# Patient Record
Sex: Female | Born: 1960 | Race: White | Hispanic: No | Marital: Married | Smoking: Former smoker
Health system: Southern US, Community
[De-identification: ages and names within clinical notes are randomized; demographics above are authoritative.]

## PROBLEM LIST (undated history)

## (undated) DIAGNOSIS — G43909 Migraine, unspecified, not intractable, without status migrainosus: Secondary | ICD-10-CM

## (undated) DIAGNOSIS — N3281 Overactive bladder: Secondary | ICD-10-CM

## (undated) DIAGNOSIS — K219 Gastro-esophageal reflux disease without esophagitis: Secondary | ICD-10-CM

## (undated) DIAGNOSIS — M1990 Unspecified osteoarthritis, unspecified site: Secondary | ICD-10-CM

## (undated) DIAGNOSIS — J309 Allergic rhinitis, unspecified: Secondary | ICD-10-CM

## (undated) DIAGNOSIS — G894 Chronic pain syndrome: Secondary | ICD-10-CM

## (undated) DIAGNOSIS — E785 Hyperlipidemia, unspecified: Secondary | ICD-10-CM

## (undated) HISTORY — PX: OTHER SURGICAL HISTORY: SHX169

## (undated) HISTORY — PX: SPINAL CORD STIMULATOR IMPLANT: SHX2422

## (undated) HISTORY — DX: Allergic rhinitis, unspecified: J30.9

## (undated) HISTORY — DX: Migraine, unspecified, not intractable, without status migrainosus: G43.909

## (undated) HISTORY — PX: SHOULDER SURGERY: SHX246

## (undated) HISTORY — DX: Overactive bladder: N32.81

## (undated) HISTORY — DX: Unspecified osteoarthritis, unspecified site: M19.90

## (undated) HISTORY — DX: Chronic pain syndrome: G89.4

## (undated) HISTORY — DX: Gastro-esophageal reflux disease without esophagitis: K21.9

## (undated) HISTORY — PX: NASAL SEPTUM SURGERY: SHX37

## (undated) HISTORY — DX: Hyperlipidemia, unspecified: E78.5

## (undated) HISTORY — PX: BREAST REDUCTION SURGERY: SHX8

---

## 1999-04-25 ENCOUNTER — Other Ambulatory Visit: Admission: RE | Admit: 1999-04-25 | Discharge: 1999-04-25 | Payer: Self-pay | Admitting: Obstetrics and Gynecology

## 1999-05-12 ENCOUNTER — Ambulatory Visit (HOSPITAL_COMMUNITY): Admission: RE | Admit: 1999-05-12 | Discharge: 1999-05-13 | Payer: Self-pay | Admitting: Neurosurgery

## 1999-05-12 ENCOUNTER — Encounter: Payer: Self-pay | Admitting: Neurosurgery

## 1999-07-30 ENCOUNTER — Ambulatory Visit (HOSPITAL_BASED_OUTPATIENT_CLINIC_OR_DEPARTMENT_OTHER): Admission: RE | Admit: 1999-07-30 | Discharge: 1999-07-30 | Payer: Self-pay | Admitting: *Deleted

## 2000-03-24 ENCOUNTER — Ambulatory Visit (HOSPITAL_COMMUNITY): Admission: RE | Admit: 2000-03-24 | Discharge: 2000-03-25 | Payer: Self-pay | Admitting: Neurosurgery

## 2000-03-24 ENCOUNTER — Encounter: Payer: Self-pay | Admitting: Neurosurgery

## 2000-07-01 ENCOUNTER — Ambulatory Visit (HOSPITAL_COMMUNITY): Admission: RE | Admit: 2000-07-01 | Discharge: 2000-07-01 | Payer: Self-pay | Admitting: Neurosurgery

## 2000-07-01 ENCOUNTER — Encounter: Payer: Self-pay | Admitting: Neurosurgery

## 2000-07-15 ENCOUNTER — Encounter: Payer: Self-pay | Admitting: Neurosurgery

## 2000-07-15 ENCOUNTER — Ambulatory Visit (HOSPITAL_COMMUNITY): Admission: RE | Admit: 2000-07-15 | Discharge: 2000-07-15 | Payer: Self-pay | Admitting: Neurosurgery

## 2000-07-29 ENCOUNTER — Ambulatory Visit (HOSPITAL_COMMUNITY): Admission: RE | Admit: 2000-07-29 | Discharge: 2000-07-29 | Payer: Self-pay | Admitting: Neurosurgery

## 2000-07-29 ENCOUNTER — Encounter: Payer: Self-pay | Admitting: Neurosurgery

## 2000-08-12 ENCOUNTER — Encounter: Payer: Self-pay | Admitting: Neurosurgery

## 2000-08-12 ENCOUNTER — Ambulatory Visit (HOSPITAL_COMMUNITY): Admission: RE | Admit: 2000-08-12 | Discharge: 2000-08-12 | Payer: Self-pay | Admitting: Neurosurgery

## 2000-09-14 ENCOUNTER — Encounter: Payer: Self-pay | Admitting: Neurosurgery

## 2000-09-15 ENCOUNTER — Inpatient Hospital Stay (HOSPITAL_COMMUNITY): Admission: RE | Admit: 2000-09-15 | Discharge: 2000-09-18 | Payer: Self-pay | Admitting: Neurosurgery

## 2000-10-07 ENCOUNTER — Ambulatory Visit (HOSPITAL_COMMUNITY): Admission: RE | Admit: 2000-10-07 | Discharge: 2000-10-07 | Payer: Self-pay | Admitting: Neurosurgery

## 2000-10-07 ENCOUNTER — Encounter: Payer: Self-pay | Admitting: Neurosurgery

## 2000-10-18 ENCOUNTER — Emergency Department (HOSPITAL_COMMUNITY): Admission: EM | Admit: 2000-10-18 | Discharge: 2000-10-18 | Payer: Self-pay | Admitting: Emergency Medicine

## 2000-12-13 ENCOUNTER — Encounter: Payer: Self-pay | Admitting: Neurosurgery

## 2000-12-13 ENCOUNTER — Ambulatory Visit (HOSPITAL_COMMUNITY): Admission: RE | Admit: 2000-12-13 | Discharge: 2000-12-13 | Payer: Self-pay | Admitting: Neurosurgery

## 2001-01-17 ENCOUNTER — Encounter: Payer: Self-pay | Admitting: Neurosurgery

## 2001-01-17 ENCOUNTER — Encounter: Admission: RE | Admit: 2001-01-17 | Discharge: 2001-01-17 | Payer: Self-pay | Admitting: Neurosurgery

## 2001-01-28 ENCOUNTER — Encounter: Payer: Self-pay | Admitting: Neurosurgery

## 2001-01-28 ENCOUNTER — Encounter: Admission: RE | Admit: 2001-01-28 | Discharge: 2001-01-28 | Payer: Self-pay | Admitting: Neurosurgery

## 2001-02-22 ENCOUNTER — Emergency Department (HOSPITAL_COMMUNITY): Admission: EM | Admit: 2001-02-22 | Discharge: 2001-02-22 | Payer: Self-pay | Admitting: Emergency Medicine

## 2001-02-22 ENCOUNTER — Encounter: Admission: RE | Admit: 2001-02-22 | Discharge: 2001-02-22 | Payer: Self-pay | Admitting: Neurosurgery

## 2001-02-22 ENCOUNTER — Encounter: Payer: Self-pay | Admitting: Neurosurgery

## 2001-03-09 ENCOUNTER — Encounter: Payer: Self-pay | Admitting: Neurosurgery

## 2001-03-09 ENCOUNTER — Encounter: Admission: RE | Admit: 2001-03-09 | Discharge: 2001-03-09 | Payer: Self-pay | Admitting: Neurosurgery

## 2007-02-08 ENCOUNTER — Other Ambulatory Visit: Admission: RE | Admit: 2007-02-08 | Discharge: 2007-02-08 | Payer: Self-pay | Admitting: Internal Medicine

## 2007-02-11 ENCOUNTER — Ambulatory Visit (HOSPITAL_COMMUNITY): Admission: RE | Admit: 2007-02-11 | Discharge: 2007-02-11 | Payer: Self-pay | Admitting: Internal Medicine

## 2007-02-15 ENCOUNTER — Encounter: Admission: RE | Admit: 2007-02-15 | Discharge: 2007-02-15 | Payer: Self-pay | Admitting: Internal Medicine

## 2007-05-30 LAB — LIPID PANEL: LDL Cholesterol: 68 mg/dL

## 2007-08-23 LAB — LIPID PANEL: LDL Cholesterol: 70 mg/dL

## 2007-08-23 LAB — HEPATIC FUNCTION PANEL
ALT: 19 U/L (ref 7–35)
AST: 22 U/L (ref 13–35)
Alkaline Phosphatase: 38 U/L (ref 25–125)
Bilirubin, Total: 0.4 mg/dL

## 2007-11-05 ENCOUNTER — Emergency Department (HOSPITAL_COMMUNITY): Admission: EM | Admit: 2007-11-05 | Discharge: 2007-11-05 | Payer: Self-pay | Admitting: Emergency Medicine

## 2007-11-05 ENCOUNTER — Ambulatory Visit: Payer: Self-pay | Admitting: Vascular Surgery

## 2008-03-27 ENCOUNTER — Ambulatory Visit (HOSPITAL_COMMUNITY): Admission: RE | Admit: 2008-03-27 | Discharge: 2008-03-27 | Payer: Self-pay | Admitting: Internal Medicine

## 2008-05-31 ENCOUNTER — Other Ambulatory Visit: Admission: RE | Admit: 2008-05-31 | Discharge: 2008-05-31 | Payer: Self-pay | Admitting: Internal Medicine

## 2009-04-29 LAB — HEMOGLOBIN A1C: Hgb A1c MFr Bld: 5.8 % (ref 4.0–6.0)

## 2009-05-02 ENCOUNTER — Encounter (INDEPENDENT_AMBULATORY_CARE_PROVIDER_SITE_OTHER): Payer: Self-pay | Admitting: *Deleted

## 2009-05-07 ENCOUNTER — Ambulatory Visit (HOSPITAL_COMMUNITY): Admission: RE | Admit: 2009-05-07 | Discharge: 2009-05-07 | Payer: Self-pay | Admitting: Internal Medicine

## 2009-05-14 ENCOUNTER — Ambulatory Visit (HOSPITAL_COMMUNITY): Admission: RE | Admit: 2009-05-14 | Discharge: 2009-05-14 | Payer: Self-pay | Admitting: Internal Medicine

## 2009-05-14 ENCOUNTER — Encounter (INDEPENDENT_AMBULATORY_CARE_PROVIDER_SITE_OTHER): Payer: Self-pay | Admitting: *Deleted

## 2009-05-14 LAB — HM DEXA SCAN: HM Dexa Scan: NORMAL

## 2009-05-15 ENCOUNTER — Ambulatory Visit: Payer: Self-pay | Admitting: Gastroenterology

## 2009-05-28 ENCOUNTER — Ambulatory Visit: Payer: Self-pay | Admitting: Gastroenterology

## 2010-05-30 ENCOUNTER — Other Ambulatory Visit
Admission: RE | Admit: 2010-05-30 | Discharge: 2010-05-30 | Payer: Self-pay | Source: Home / Self Care | Admitting: Internal Medicine

## 2010-06-05 ENCOUNTER — Ambulatory Visit (HOSPITAL_COMMUNITY)
Admission: RE | Admit: 2010-06-05 | Discharge: 2010-06-05 | Payer: Self-pay | Source: Home / Self Care | Attending: Internal Medicine | Admitting: Internal Medicine

## 2010-06-05 LAB — HM MAMMOGRAPHY: HM Mammogram: NEGATIVE

## 2010-06-20 ENCOUNTER — Ambulatory Visit
Admission: RE | Admit: 2010-06-20 | Discharge: 2010-06-20 | Payer: Self-pay | Source: Home / Self Care | Attending: Orthopedic Surgery | Admitting: Orthopedic Surgery

## 2010-06-24 NOTE — Op Note (Signed)
NAMEHENYA, Arnold           ACCOUNT NO.:  [id number]  MEDICAL RECORD NO.:  [id number]          PATIENT TYPE:  AMB  LOCATION:  DSC                          FACILITY:  [facility]  PHYSICIAN:  Katy Fitch. Tameyah Koch, M.D. 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  OPERATION:  Release of right thumb A1 pulley under local anesthesia. This was a minor operating room procedure.  INDICATIONS:  Lisa Arnold is a 50 year old woman who has history of severe spinal arthrosis.  She has had multiple spinal procedures and has a chronic spinal cord stimulator.  She presented for evaluation of bilateral shoulder pain with evidence of mild adhesive capsulitis bilaterally.  She has a history of chronic stenosing tenosynovitis of her right thumb.  She had a prior steroid injection by Dr. Melvyn Novas. This was unrelieved.  She requested that we proceed directly to release of the A1 pulley.  After informed consent, she is brought to the operative room at this time.  PROCEDURE:  Lisa Arnold was brought to room one at the [facility] and placed in a supine position upon the operating table.  Following Betadine prep of the thumb, 2.5 mL of 2% lidocaine were infiltrated into the path of the intended incision.  The flexor sheath was likewise irrigated.  Thereafter, the right hand and arm were prepped with Betadine soap and solution, sterilely draped.  A pneumatic tourniquet was applied to the proximal right brachium.  Following exsanguination of the right hand and arm by fist compression and direct compression, the arterial tourniquet was inflated to 250 mmHg.  Procedure commenced with a routine surgical time-out.  The skin sensation  was tested and found to be anesthetic.  An 1-cm incision was fashioned transversely directly over the palpably thickened A1 pulley. Subcutaneous tissues were carefully divided taking care to retract the radial ulnar proper, digital nerves, and arteries.  There was a rather thickened A1 pulley that extended very proximal over the volar plate of the MP joint.  This was identified, split with scalpel and scissors.  A Henner elevator was then passed alongside the tendon.  The tendon was delivered.  There was some necrosis due to chronic compression and/or steroid.  This was debrided.  Thereafter, Lisa Arnold demonstrated full range of motion of her thumb in flexion/extension PIP joint.  The wound was repaired with a vertical mattress sutures of 5-0 nylon x2.  The thumb was dressed with Xeroflo, sterile gauze, and an Ace bandage dressing.  For aftercare, she is advised to return to the office in 7-10 days for suture removal.  Her husband is traveling to [place] this next week so it may be 10 days.  She was cautioned to have the sutures removed earlier if there is any sign of rubor.  Questions regarding wound care were invited and answered in detail.  For aftercare, she is provided prescription for Vicodin 5 mg one p.o. q.4-6 h. p.r.n. pain, 20  tablets without refill.  We will see her back for followup sooner p.r.n. problems.     Katy Fitch Kden Wagster, M.D.     RVS/MEDQ  D:  06/20/2010  T:  06/21/2010  Job:  [id number]  cc:   Lisa Arnold, D.O.  Electronically Signed by Josephine Igo M.D. on 06/24/2010 03:57:24 PM

## 2010-06-24 NOTE — Procedures (Signed)
Summary: Colonoscopy  Patient: Lisa Arnold Note: All result statuses are Final unless otherwise noted.  Tests: (1) Colonoscopy (COL)   COL Colonoscopy           DONE     [facility]     [address]     [place], [place]  [address]           COLONOSCOPY PROCEDURE REPORT           PATIENT:  Lisa, Arnold  MR#:  [id number]     BIRTHDATE:  1961-02-20, 48 yrs. old  GENDER:  female           ENDOSCOPIST:  Barbette Hair. Arlyce Dice, MD     Referred by:           PROCEDURE DATE:  05/28/2009     PROCEDURE:  Colonoscopy, Diagnostic     ASA CLASS:  Class I     INDICATIONS:  Routine Risk Screening           MEDICATIONS:   Fentanyl 125 mcg IV, Versed 12.5 mg IV, Benadryl 50     mg IV           DESCRIPTION OF PROCEDURE:   After the risks benefits and     alternatives of the procedure were thoroughly explained, informed     consent was obtained.  Digital rectal exam was performed and     revealed no abnormalities.   The LB PCF-Q180AL [id number] endoscope     was introduced through the anus and advanced to the cecum, which     was identified by both the appendix and ileocecal valve, without     limitations.  The quality of the prep was good, using MoviPrep.     The instrument was then slowly withdrawn as the colon was fully     examined.     <<PROCEDUREIMAGES>>           FINDINGS:  Melanosis coli was found.  This was otherwise a normal     examination of the colon (see image4, image7, image8, image10,     image11, image13, image15, image18, and image19).   Retroflexed     views in the rectum revealed no abnormalities.    The scope was     then withdrawn from the patient and the procedure completed.           COMPLICATIONS:  None           ENDOSCOPIC IMPRESSION:     1) Melanosis     2) Otherwise normal examination     RECOMMENDATIONS:     1) Continue current colorectal screening recommendations for     "routine risk" patients with a repeat colonoscopy in 10 years.          REPEAT EXAM:  In 10 year(s) for Colonoscopy.           ______________________________     Barbette Hair. Arlyce Dice, MD           CC:  Marisue Brooklyn, DO           n.     eSIGNED:   Barbette Hair. Shawnelle Spoerl at 05/28/2009 12:14 PM           Raina Mina, [id number]  Note: An exclamation mark (!) indicates a result that was not dispersed into the flowsheet. Document Creation Date: 05/28/2009 12:12 PM _______________________________________________________________________  (1) Order result status: Final Collection or observation date-time: 05/28/2009 12:09 Requested date-time:  Receipt date-time:  Reported date-time:  Referring Physician:   Ordering Physician: Melvia Heaps [id number]) Specimen Source:  Source: Launa Grill Order Number: [id number] Lab site:   Appended Document: Colonoscopy    Clinical Lists Changes  Observations: Added new observation of COLONNXTDUE: 05/2019 (05/28/2009 12:45)

## 2010-08-10 LAB — GLUCOSE, CAPILLARY: Glucose-Capillary: 72 mg/dL (ref 70–99)

## 2010-10-10 NOTE — Op Note (Signed)
[place]. [facility]  Patient:    Lisa Arnold, Lisa Arnold                    MRN: [id number] Proc. Date: 09/14/00 Adm. 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  OPERATIONS PERFORMED: 1. Re-exploration of lumbar 4-5 laminotomy with bilateral lumbar 4-5    microdiskectomy. 2. Posterior lumbar body fusion utilizing a tangent wedge and local    autograft. 3. Posterolateral fusion utilizing pedicle screws; patient local autograft. 4. Microdissection.  SURGEON:  Julio Sicks, M.D.  ASSISTANT:  Reinaldo Meeker, M.D.  ANESTHESIA:  General endotracheal.  INDICATIONS:  Ms. Schwenn is a 50 year old female who is status post two previous left-sided L4-5 laminotomies and microdiskectomies.  The patient presents with worsening back and left lower extremity pain consistent with both left-sided L4 and L5 radiculopathies.  The patient has failed conservative management including epidural steroid injections.  MRI scanning demonstrates evidence of instability as well as some degree of foraminal narrowing bilaterally at L4, coupled with a small left-sided L4-5 recurrent disk herniation.  The patient has been counseled as to her options.  She has decided to proceed with an L4-5  decompression and fusion for hopeful improvement of her symptoms.  DESCRIPTION OF PROCEDURE:  Patient was brought to the operating room and placed on the table in the supine position.  After an adequate level of anesthesia was achieved patient was placed prone onto a Wilson frame and appropriately padded for operation of the lumbar region.  Area was shaved and sterilely prepped with  Betadine.  Outlined the area of skin incision overlying the L3, L4 and L5 levels.  __________________________ in midline.  Subperiosteal dissection was then performed excising the lamina and facet joints of L4, L5 and the inferior aspect of the lamina of L3.  The transverse processes of L4 and L5 were dissected free.  Deep self-retaining retractors were placed.  X-rays taken, the level was confirmed.  A complete laminectomy of L4 was then performed using Leksell rongeurs and the Kerrison rongeurs.  The remaining aspects of the laminotomy on the left side were dissected free using dental instruments.  All bone was used for later fusion.  The superior one-third of the lamina of L5 was resected.  The entire inferior facet of L4 was resected bilaterally.  The superior aspect of the superior facet of L5 was resected bilaterally.  Ligamentum flavum was then elevated and resected in a piecemeal fashion using Kerrison rongeurs. Epidural scar was resected using dental instruments and sharp dissection.  The thecal sac _____ the L4 and L5 nerve roots were identified bilaterally. Attention was then placed first on the patients right side.  Microscope was brought into the field for microdissection of the epidural venous plexus and underlying disk space.  Epidural venous plexus was coagulated and cut.  Thecal sac and L5 nerve root were mobilized and retracted towards the midline.  Disk space was then incised with a 15 blade in a rectangular fashion.  Wide disk space clean out was then achieved using pituitary rongeurs, upward angle pituitary rongeurs and Epstein curets.  All loose __________  disk material was removed from the interspace.  The disk space was then progressively dilated up to a 9 mm level.  Attention was then placed on the contralateral side.  Epidural scar was dissected free using dental instruments and sharp dissection.  Thecal sac and L5 nerve root were mobilized and retracted towards  the midline.  The disk space was isolated and incised with a 15 blade.  A wide disk clean out was then achieved using pituitary rongeurs and upward angle pituitary rongeurs, and Epstein curets.  Once again the disk space was progressively distracted up to 9 mm and the distractor was left in place.  Microscope was removed.  Attention was then placed to the right side.  Thecal sac and nerve roots were protected.  The disk space was then reamed and then cut for an 8 mm tangent wedge.  An 8 mm x 26 mm tangent wedge was then impacted into place and recessed approximately 3 mm in the posterior cortical border.  Retractor system was remove.  Wedge was found to be well positioned by intraoperative fluoroscopy.  The procedure was then repeated on the contralateral side, again, without complication.  Prior to instillation of the second wedge morcellized autograft was packed into the interspace.  Once the second wedge was confirmed to be in good position attention was then placed to placing pedicle screw instrumentation.  The pedicles of L4 and L5 were then isolated using fluoroscopy.  Superficial cortical bone was the removed using the high-speed drill.  A pedicle awl was used to pass through the pedicle and into the vertebral bodies of the L4 and L5 bilaterally.  Each awl tract was found to be solidly within bone using a blunt probe pass.  Each awl tract was then tapped with a 5.25 mm tap.  Once again the tapped hole was solidly within bone.  At L4 6.75 x 45 mm variable angled STRS pedicle screws were then placed bilaterally.  A L5 6.75 x 35 mm STRS variable angled pedicle screws were placed bilaterally.  All screws were given a final tightening.  A short segment of titanium rod was then contoured and placed over the screw heads at L4 and L5.  Locking caps were then engaged over the screw heads.  The caps were then completely tightened in a sequential fashion in order to put the constrict under  compression.  At this point the neural foramen of L4 and L5 were widely patent.  The bone grafts were quite secure.   The pedicle screw in place was well positioned.  Final image was used under fluoroscopy and revealed good position of bone grafts and hardware at proper _____ level at the normal end the spine.  The wound was then copiously irrigated with antibiotic solution.  The transverse processes of L4 and L5 were decorticated using the high-speed drill.  Morcellized autograft was packed posterolaterally.  Gelfoam was left in the epidural space for hemostasis, which was found to be good.  A medium Hemovac drain was left in the epidural space.  The wound was then closed Vicryl in multiple layers.  Steri-Strips and sterile dressing were applied.  There were no apparent complications.  Patient tolerated the procedure well and was transferred to the recovery room in the postoperative period. DD:  09/14/00 TD:  09/15/00 Job: [id number]

## 2010-10-10 NOTE — Op Note (Signed)
[place]. [facility]  Patient:    Lisa Arnold, Lisa Arnold                    MRN: [id number] Proc. Date: 03/24/00 Adm. Date:  [id number] Attending:  Donn Pierini                           Operative Report  PREOPERATIVE DIAGNOSIS:  Left L4-5 recurrent herniated nucleus pulposus with _______.  POSTOPERATIVE DIAGNOSIS:  Left L4-5 recurrent herniated nucleus pulposus with _________.  PROCEDURE:  Re-exploration of left L4-5 laminotomy with microdiskectomy.  SURGEON:  Julio Sicks, M.D.  ASSISTANT:  Donzetta Sprung. Roney Jaffe., M.D.  ANESTHESIA:  General endotracheal.  INDICATIONS:  Lisa Arnold is a 50 year old female who is approximately two years status post left-sided L4-5 laminotomy and microdiskectomy. Postoperatively the patient had done very well until approximately one month ago, when the patient began having recurrent left lower extremity symptoms causing a left-sided L5 radiculopathy.  An MRI scanning demonstrated a large left-sided L4-5 recurrent disk herniation.  The patient was then counseled as to her options.  She decided to proceed with a left-sided L4-5 re-exploration of the laminotomy with a redo microdiskectomy.  DESCRIPTION OF PROCEDURE:  The patient was brought to the operating room and placed in the supine position.  After general anesthesia was achieved, the patient was positioned prone onto a Wilson frame.  She was appropriately padded.  The patients lumbar region was shaved and prepped sterilely.  A linear skin incision overlying the L4-5 interspace.  The skin was entered sharply in the midline.  A subperiosteal dissection was performed on the left side, exposing the lamina and the facet joints of L4 and L5.  A deep self-retaining retractor was placed.  X-rays were taken.  The level was confirmed.  The old laminotomy was then dissected free using dental instruments.  The lamina and facet joints were slightly undercut using a 2.0 mm Kerrison  rongeur.  The microscope was brought into the field and used for microdissection.  The left-sided L5 nerve root was identified along the course of the medial wall of the pedicle.  This was dissected free and retracted towards the midline.  A plane was developed beneath the nerve root itself, and was tracked up towards the disk space.  A moderate amount of free disk herniation was encountered and resected using pituitary rongeurs and blunt nerve hooks.  The disk space was then isolated and incised with a #15 blade in a rectangular fashion.  An aggressive diskectomy was then performed, removing all loose and ________ disk material from the interspace.  At this point there is no evidence of any continued compression.  A blunt probe was _____ easily and went into the thecal sac, and exiting the L5 nerve root.  The wound was then copiously irrigated with antibiotic solution.  Gelfoam was placed operative for hemostasis, and was found to be good.  The microscope and retractors were removed.  Hemostasis in the muscle was achieved with electrocautery.  The wounds were then closed in layers with Vicryl sutures. Steri-Strips were applied to the surface.  There were no apparent complications.  The patient tolerated the operation well and was returned to the recovery room postoperatively.DD:  03/24/00 TD:  03/24/00 Job: [id number]

## 2010-10-10 NOTE — Discharge Summary (Signed)
[place]. [facility]  Patient:    Lisa Arnold, Lisa Arnold                    MRN: [id number] Adm. Date:  [id number] Disc. Date: [id number] Attending:  Molpus, John L                           Discharge Summary  FINAL DIAGNOSIS:  L4-5 recurrent herniated nucleus pulposus with radiculopathy.  OPERATIONS PERFORMED:  Re-exploration of L4-5 laminectomy with L4-5 bilateral redo microdiskectomies, L4-5 posterior lumbar body fusion utilizing tangent wedges and local autograft, and L4-5 posterolateral fusion utilizing pedicle screw fixation and local autograft.  HISTORY OF PRESENT ILLNESS:  Lisa Arnold is a 50 year old female who has previously undergone a left-sided L4-5 laminotomy and microdiskectomy.  She initially did quite well.  She developed worsening back and left lower extremity pain remotely.  She has failed conservatively management, including epidural steroid injections, rest, activity modifications, and time.  MRI scanning demonstrates a small left-sided recurrence with foraminal collapse. The patient is predominantly having left-sided L4 symptoms.  We discussed options for management, including undergoing decompression and fusion surgery. The patient wishes to undergo surgery to hopefully improve her symptoms.  HOSPITAL COURSE:  The patient is taken to the operating room where an uncomplicated L4-5 decompression and fusion surgery was performed. Postoperatively, the patient awaken with complete resolution of her radicular pain.  Her strength and sensation were intact.  She had some initial difficulty with postoperative pain control, which is incisional in nature. This gradually improved.  She had some difficulty with constipation, which also improved.  On her fourth postoperative day, the patient was feeling much improved.  She was having minimal back pain.  She was having no significant lower extremity pain.  Her wound is healing well.  I encouraged her  to increase her activities further.  DISPOSITION:  We discharged her to home.  CONDITION AT DISCHARGE:  Improved.  DISCHARGE MEDICATIONS:  OxyContin, Percocet, and Valium as needed.  The patient is to resume all home medications.  DISCHARGE ACTIVITIES:  Very light.  She is to wear her brace whenever up.  DISCHARGE FOLLOW-UP:  In one week in my office. DD:  10/27/00 TD:  10/28/00 Job: [id number]

## 2010-10-24 ENCOUNTER — Other Ambulatory Visit (HOSPITAL_COMMUNITY): Payer: Self-pay | Admitting: Orthopedic Surgery

## 2010-10-24 DIAGNOSIS — M75102 Unspecified rotator cuff tear or rupture of left shoulder, not specified as traumatic: Secondary | ICD-10-CM

## 2010-11-04 ENCOUNTER — Ambulatory Visit (HOSPITAL_COMMUNITY)
Admission: RE | Admit: 2010-11-04 | Discharge: 2010-11-04 | Disposition: A | Discharge status: Home / Self Care | Payer: BC Managed Care – PPO | Source: Ambulatory Visit | Attending: Orthopedic Surgery | Admitting: Orthopedic Surgery

## 2010-11-04 DIAGNOSIS — M67919 Unspecified disorder of synovium and tendon, unspecified shoulder: Secondary | ICD-10-CM | POA: Insufficient documentation

## 2010-11-04 DIAGNOSIS — M719 Bursopathy, unspecified: Secondary | ICD-10-CM | POA: Insufficient documentation

## 2010-11-04 DIAGNOSIS — M25519 Pain in unspecified shoulder: Secondary | ICD-10-CM | POA: Insufficient documentation

## 2010-11-04 DIAGNOSIS — M75102 Unspecified rotator cuff tear or rupture of left shoulder, not specified as traumatic: Secondary | ICD-10-CM

## 2010-12-25 ENCOUNTER — Ambulatory Visit (HOSPITAL_BASED_OUTPATIENT_CLINIC_OR_DEPARTMENT_OTHER)
Admission: RE | Admit: 2010-12-25 | Discharge: 2010-12-25 | Disposition: A | Discharge status: Home / Self Care | Payer: BC Managed Care – PPO | Source: Ambulatory Visit | Attending: Orthopedic Surgery | Admitting: Orthopedic Surgery

## 2010-12-25 DIAGNOSIS — M67919 Unspecified disorder of synovium and tendon, unspecified shoulder: Secondary | ICD-10-CM | POA: Insufficient documentation

## 2010-12-25 DIAGNOSIS — X58XXXA Exposure to other specified factors, initial encounter: Secondary | ICD-10-CM | POA: Insufficient documentation

## 2010-12-25 DIAGNOSIS — S43439A Superior glenoid labrum lesion of unspecified shoulder, initial encounter: Secondary | ICD-10-CM | POA: Insufficient documentation

## 2010-12-25 DIAGNOSIS — M66329 Spontaneous rupture of flexor tendons, unspecified upper arm: Secondary | ICD-10-CM | POA: Insufficient documentation

## 2010-12-25 DIAGNOSIS — M719 Bursopathy, unspecified: Secondary | ICD-10-CM | POA: Insufficient documentation

## 2010-12-25 DIAGNOSIS — Z01812 Encounter for preprocedural laboratory examination: Secondary | ICD-10-CM | POA: Insufficient documentation

## 2010-12-25 DIAGNOSIS — M9420 Chondromalacia, unspecified site: Secondary | ICD-10-CM | POA: Insufficient documentation

## 2010-12-25 LAB — POCT HEMOGLOBIN-HEMACUE: Hemoglobin: 14.1 g/dL (ref 12.0–15.0)

## 2010-12-30 NOTE — Op Note (Signed)
NAMEGENIFER, LAZENBY           ACCOUNT NO.:  [id number]  MEDICAL RECORD NO.:  [id number]  LOCATION:                                 FACILITY:  PHYSICIAN:  Katy Fitch. Antwoin Lackey, M.D. DATE OF BIRTH:  1961-03-03  DATE OF PROCEDURE:  12/25/2010 DATE OF DISCHARGE:                              OPERATIVE REPORT   PREOPERATIVE DIAGNOSIS:  Chronic pain, left shoulder dating back to September 2011 with signs of chronic internal derangement, rule out SLAP tear, rule out labral tear, rule out biceps pathology, rule out glenohumeral articular pathology, rule out rotator cuff tear.  POSTOPERATIVE DIAGNOSES: 1. Degenerative 75% tear of biceps origin. 2. Type 2 SLAP tear. 3. Grade 4 chondromalacia of glenoid with full-thickness hyaline     cartilage loss and loose fragment of cartilage at posterior central     glenoid. 4. Subacromial bursitis.  OPERATION: 1. Diagnostic arthroscopy, left shoulder. 2. Arthroscopic labral debridement and biceps tenotomy. 3. Arthroscopic debridement of free fragments of hyaline cartilage     followed by abrasion chondroplasty with microfracture of glenoid     hyaline cartilage defect. 4. Arthroscopic subacromial examination and bursectomy.  OPERATING SURGEON:  Katy Fitch. Neriah Brott, MD.  ASSISTANT:  Marveen Reeks Dasnoit, PAC.  ANESTHESIA:  General by endotracheal technique.  SUPERVISING ANESTHESIOLOGIST:  Janetta Hora. Gelene Mink, MD.  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  Her primary care physician is Dr. Marisue Brooklyn.  She was initially evaluated by Dr. Bradly Bienenstock of [facility].  Dr. Melvyn Novas treated her stenosing tenosynovitis with injection.  Unfortunately, she had a recurrence.  She had persistent shoulder pain and elected to seek an alternative upper extremity  orthopedic consult on May 28, 2010.  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  We discussed both predicaments in detail.  Her plain x-rays of the shoulder were nondiagnostic.  She has a chronic spinal cord stimulator implant that precludes obtaining an MRI. Therefore, we decided to proceed with a diagnostic block of her glenohumeral joint by injecting preservative-free 1% lidocaine and Depo- Medrol.  Following the injection, her pain was not substantially changed.  With gentle range of motion exercises, she recovered near full range of motion.  She subsequently decided to proceed with the release of her right thumb A1 pulley.  This was accomplished on June 20, 2010.  She went on to heal her thumb A1 pulley release without complications.  Ms. Badley subsequently recounted in my office on October 24, 2010 stating that her shoulder pain continued to be quite problematic.  In that, we could not obtain an MRI.  I sent her for a detailed ultrasound with Dr. Francene Boyers, radiologist.  Dr. Jena Gauss noted an intact rotator cuff at biceps tendon in the bicipital groove and no evidence of calcific tendinopathy.  There was evidence of subdeltoid bursitis.  We injected Ms. Kerekes's shoulder with Depo-Medrol and lidocaine at the subacromial  space.  This did transiently improve her discomfort.  With return of discomfort into the shoulder, she requested that we perform diagnostic arthroscopy.  I outlined for her the methodology of a diagnostic arthroscopy and the possible clinical predicaments we could encounter including biceps tendon pathology, SLAP tear, glenohumeral degenerative arthritis, loose bodies within the joint, villonodular synovitis, bursitis, and rotator cuff tear.  She agreed to proceed with  treatment as appropriate to our findings at the time of surgery.  She was scheduled for this procedure at the [facility] on December 25, 2010.  Preoperatively, she was interviewed by Dr. Gelene Mink of Anesthesia.  He recommended proceeding with a plexus block preoperatively.  This was accomplished with ultrasound control without difficulty.  After questions were invited and answered with Ms. Lyon and her husband in the holding area, she was brought to room 2 of the [facility] and placed in supine position on the operating table.  Following the induction of general endotracheal anesthesia under Dr. Thornton Dales direct supervision, she was carefully positioned in the beach-chair position with the aid of a torso and head of holder designed for shoulder arthroscopy.  The entire left upper extremity forequarter prepped with DuraPrep and draped with impervious arthroscopy drapes.  Examination of her shoulder revealed no clinical adhesive capsulitis.  After routine surgical time-out, the scope was introduced through a standard posterior viewing portal.  Several significant pathologies were immediately evident.  Ms. Dalpe had an area of full-thickness hyaline cartilage loss at the central posterior aspect of her glenoid.  There was a flap of free cartilage and complete exposure of the subchondral bone.  The biceps tendon had a 75% degenerative tear at the labrum and was a type 2 degenerative labral tear.  The subscapularis had about 20% degenerative area at the superior aspect of its insertion.  The supraspinatus, infraspinatus had normal appearance within the joint capsule.  The humeral head was carefully inspected and found to be normal.  The superior and inferior recesses were normal.  A suction shaver was brought in anteriorly and used to perform debridement of the free fragment cartilage at the site of chondromalacia.  The long head of biceps was treated by  tenotomy and debridement followed by electrocautery for hemostasis.  The unstable portions of the labrum were debrided with a suction shaver.  The anterior labrum, the anterior, superior, middle, and inferior glenohumeral ligaments were normal.  There were no loose bodies in the inferior recess.  The scope was removed from glenohumeral joint and placed in the subacromial space.  There was significant bursitis.  A complete bursectomy was accomplished followed by bipolar hemostasis.  In my judgment, the [facility] joint was not problematic.  The coracoacromial ligament and the anterolateral and medial acromion morphology was all within normal limits.  Given the circumstance, we subsequently irrigated the subacromial space and removed the arthroscopic equipment.  Portals were repaired with 3-0 Prolene.  Sterile gauze dressings were applied with paper tape.  Ms. Viger was transferred to the recovery room with stable vital signs.  She will be discharged in the care of her husband with prescriptions for Dilaudid 2 mg 1-2 tablets p.o. q.4-6 hours p.r.n. pain, 30 tablets without a refill.  Also Motrin 600 mg 1 p.o. q.6 h p.r.n. pain and Keflex 500 mg 1 p.o. q.8 h x4 days as a prophylactic antibiotic.  We will see her back in followup in 24 hours in our office to review her arthroscopic images, perform a dressing change, and  initiate gentle range of motion exercises.     Katy Fitch Rillie Riffel, M.D.     RVS/MEDQ  D:  12/25/2010  T:  12/25/2010  Job:  [id number]  Electronically Signed by Josephine Igo M.D. on 12/30/2010 08:22:10 AM

## 2011-03-03 LAB — LIPID PANEL
Cholesterol: 129 mg/dL (ref 0–200)
HDL: 51 mg/dL (ref 35–70)
LDL Cholesterol: 59 mg/dL
Triglycerides: 97 mg/dL (ref 40–160)

## 2011-03-03 LAB — HEPATIC FUNCTION PANEL: Alkaline Phosphatase: 50 U/L (ref 25–125)

## 2011-06-01 ENCOUNTER — Other Ambulatory Visit: Payer: Self-pay | Admitting: Family Medicine

## 2011-06-01 DIAGNOSIS — Z1231 Encounter for screening mammogram for malignant neoplasm of breast: Secondary | ICD-10-CM

## 2011-07-14 ENCOUNTER — Ambulatory Visit (HOSPITAL_COMMUNITY)
Admission: RE | Admit: 2011-07-14 | Discharge: 2011-07-14 | Disposition: A | Discharge status: Home / Self Care | Payer: BC Managed Care – PPO | Source: Ambulatory Visit | Attending: Family Medicine | Admitting: Family Medicine

## 2011-07-14 DIAGNOSIS — Z1231 Encounter for screening mammogram for malignant neoplasm of breast: Secondary | ICD-10-CM | POA: Insufficient documentation

## 2011-08-03 ENCOUNTER — Other Ambulatory Visit: Payer: Self-pay | Admitting: Orthopedic Surgery

## 2011-08-03 DIAGNOSIS — M25512 Pain in left shoulder: Secondary | ICD-10-CM

## 2011-08-06 ENCOUNTER — Ambulatory Visit
Admission: RE | Admit: 2011-08-06 | Discharge: 2011-08-06 | Disposition: A | Discharge status: Home / Self Care | Payer: BC Managed Care – PPO | Source: Ambulatory Visit | Attending: Orthopedic Surgery | Admitting: Orthopedic Surgery

## 2011-08-06 DIAGNOSIS — M25512 Pain in left shoulder: Secondary | ICD-10-CM

## 2011-10-29 ENCOUNTER — Other Ambulatory Visit: Payer: Self-pay | Admitting: Neurosurgery

## 2011-10-29 ENCOUNTER — Other Ambulatory Visit (HOSPITAL_COMMUNITY): Payer: Self-pay | Admitting: Neurosurgery

## 2011-10-29 DIAGNOSIS — M546 Pain in thoracic spine: Secondary | ICD-10-CM

## 2011-10-29 DIAGNOSIS — M545 Low back pain: Secondary | ICD-10-CM

## 2011-10-29 DIAGNOSIS — M542 Cervicalgia: Secondary | ICD-10-CM

## 2011-11-03 ENCOUNTER — Encounter (HOSPITAL_COMMUNITY): Payer: Self-pay | Admitting: Pharmacy Technician

## 2011-11-05 ENCOUNTER — Ambulatory Visit (HOSPITAL_COMMUNITY)
Admission: RE | Admit: 2011-11-05 | Discharge: 2011-11-05 | Disposition: A | Discharge status: Home / Self Care | Payer: BC Managed Care – PPO | Source: Ambulatory Visit | Attending: Neurosurgery | Admitting: Neurosurgery

## 2011-11-05 DIAGNOSIS — M79609 Pain in unspecified limb: Secondary | ICD-10-CM | POA: Insufficient documentation

## 2011-11-05 DIAGNOSIS — M542 Cervicalgia: Secondary | ICD-10-CM

## 2011-11-05 DIAGNOSIS — Z981 Arthrodesis status: Secondary | ICD-10-CM | POA: Insufficient documentation

## 2011-11-05 DIAGNOSIS — M545 Low back pain: Secondary | ICD-10-CM

## 2011-11-05 DIAGNOSIS — M549 Dorsalgia, unspecified: Secondary | ICD-10-CM | POA: Insufficient documentation

## 2011-11-05 DIAGNOSIS — M5126 Other intervertebral disc displacement, lumbar region: Secondary | ICD-10-CM | POA: Insufficient documentation

## 2011-11-05 DIAGNOSIS — M25519 Pain in unspecified shoulder: Secondary | ICD-10-CM | POA: Insufficient documentation

## 2011-11-05 DIAGNOSIS — M5412 Radiculopathy, cervical region: Secondary | ICD-10-CM | POA: Insufficient documentation

## 2011-11-05 DIAGNOSIS — M546 Pain in thoracic spine: Secondary | ICD-10-CM

## 2011-11-05 MED ORDER — ONDANSETRON HCL 4 MG/2ML IJ SOLN: 4.0000 mg | Freq: Four times a day (QID) | INTRAMUSCULAR | Status: DC | PRN

## 2011-11-05 MED ORDER — OXYCODONE-ACETAMINOPHEN 5-325 MG PO TABS
1.0000 | ORAL_TABLET | ORAL | Status: DC | PRN
  Administered 2011-11-05: 2 via ORAL

## 2011-11-05 MED ORDER — IOHEXOL 300 MG/ML  SOLN
10.0000 mL | Freq: Once | INTRAMUSCULAR | Status: AC | PRN
  Administered 2011-11-05: 10 mL via INTRATHECAL

## 2011-11-05 MED ORDER — DIAZEPAM 5 MG PO TABS
10.0000 mg | ORAL_TABLET | Freq: Once | ORAL | Status: AC
  Administered 2011-11-05: 10 mg via ORAL

## 2011-11-05 MED ORDER — DIAZEPAM 5 MG PO TABS
ORAL_TABLET | ORAL | Status: AC
  Administered 2011-11-05: 10 mg via ORAL
  Filled 2011-11-05: qty 2

## 2011-11-05 MED ORDER — OXYCODONE-ACETAMINOPHEN 5-325 MG PO TABS
ORAL_TABLET | ORAL | Status: AC
  Filled 2011-11-05: qty 2

## 2011-11-05 NOTE — Discharge Instructions (Signed)

## 2011-11-05 NOTE — Procedures (Signed)
Omnipaque 300 6 cc L4/5

## 2011-11-10 ENCOUNTER — Other Ambulatory Visit: Payer: Self-pay | Admitting: Neurosurgery

## 2011-11-12 ENCOUNTER — Encounter (HOSPITAL_COMMUNITY): Payer: Self-pay | Admitting: Pharmacy Technician

## 2011-11-13 ENCOUNTER — Telehealth: Payer: Self-pay | Admitting: Gastroenterology

## 2011-11-13 NOTE — Telephone Encounter (Signed)
Forward to Dr. Melvia Heaps for review on 11-13-11 ym

## 2011-11-17 ENCOUNTER — Encounter (HOSPITAL_COMMUNITY): Payer: Self-pay

## 2011-11-17 ENCOUNTER — Encounter (HOSPITAL_COMMUNITY)
Admission: RE | Admit: 2011-11-17 | Discharge: 2011-11-17 | Disposition: A | Discharge status: Home / Self Care | Payer: BC Managed Care – PPO | Source: Ambulatory Visit | Attending: Neurosurgery | Admitting: Neurosurgery

## 2011-11-17 LAB — CBC
HCT: 39.9 % (ref 36.0–46.0)
MCHC: 34.1 g/dL (ref 30.0–36.0)
MCV: 90.3 fL (ref 78.0–100.0)
Platelets: 351 10*3/uL (ref 150–400)
RDW: 11.9 % (ref 11.5–15.5)

## 2011-11-17 NOTE — Pre-Procedure Instructions (Addendum)
20 Lisa Arnold  11/17/2011   Your procedure is scheduled on:  Friday June 28  Report to [facility] at 11:30 AM.  Call this number if you have problems the morning of surgery: [phone]   Remember:   Do not eat food:After Midnight.  May have clear liquids:until Midnight .  Clear liquids include soda, tea, black coffee, apple or grape juice, broth.  Take these medicines the morning of surgery with A SIP OF WATER: Valium, Protonix, Nucynta or Percocet if needed. May use nasal sprays.   Stop aspirin and herbal medications today if not already stopped.    Do not wear jewelry, make-up or nail polish.  Do not wear lotions, powders, or perfumes. You may wear deodorant.  Do not shave 48 hours prior to surgery. Men may shave face and neck.  Do not bring valuables to the hospital.  Contacts, dentures or bridgework may not be worn into surgery.  Leave suitcase in the car. After surgery it may be brought to your room.  For patients admitted to the hospital, checkout time is 11:00 AM the day of discharge.   Patients discharged the day of surgery will not be allowed to drive home.  Name and phone number of your driver: NA  Special Instructions: CHG Shower Use Special Wash: 1/2 bottle night before surgery and 1/2 bottle morning of surgery.   Please read over the following fact sheets that you were given: Pain Booklet, Coughing and Deep Breathing and Surgical Site Infection Prevention

## 2011-11-18 ENCOUNTER — Encounter: Payer: Self-pay | Admitting: Internal Medicine

## 2011-11-18 ENCOUNTER — Other Ambulatory Visit (INDEPENDENT_AMBULATORY_CARE_PROVIDER_SITE_OTHER): Payer: No Typology Code available for payment source

## 2011-11-18 ENCOUNTER — Ambulatory Visit (INDEPENDENT_AMBULATORY_CARE_PROVIDER_SITE_OTHER): Payer: No Typology Code available for payment source | Admitting: Internal Medicine

## 2011-11-18 VITALS — BP 110/82 | HR 89 | Temp 97.1°F | Ht 63.5 in | Wt 155.4 lb

## 2011-11-18 DIAGNOSIS — J309 Allergic rhinitis, unspecified: Secondary | ICD-10-CM | POA: Insufficient documentation

## 2011-11-18 DIAGNOSIS — Z79899 Other long term (current) drug therapy: Secondary | ICD-10-CM

## 2011-11-18 DIAGNOSIS — E785 Hyperlipidemia, unspecified: Secondary | ICD-10-CM

## 2011-11-18 DIAGNOSIS — M1990 Unspecified osteoarthritis, unspecified site: Secondary | ICD-10-CM | POA: Insufficient documentation

## 2011-11-18 DIAGNOSIS — Z803 Family history of malignant neoplasm of breast: Secondary | ICD-10-CM

## 2011-11-18 DIAGNOSIS — K219 Gastro-esophageal reflux disease without esophagitis: Secondary | ICD-10-CM | POA: Insufficient documentation

## 2011-11-18 DIAGNOSIS — G894 Chronic pain syndrome: Secondary | ICD-10-CM

## 2011-11-18 DIAGNOSIS — G43909 Migraine, unspecified, not intractable, without status migrainosus: Secondary | ICD-10-CM | POA: Insufficient documentation

## 2011-11-18 LAB — LIPID PANEL: Total CHOL/HDL Ratio: 3

## 2011-11-18 LAB — HEPATIC FUNCTION PANEL
AST: 23 U/L (ref 0–37)
Alkaline Phosphatase: 42 U/L (ref 39–117)
Bilirubin, Direct: 0.1 mg/dL (ref 0.0–0.3)
Total Bilirubin: 0.7 mg/dL (ref 0.3–1.2)

## 2011-11-18 NOTE — Progress Notes (Signed)
Subjective:    Patient ID: Lisa Arnold, female    DOB: 10-May-1961, 51 y.o.   MRN: [id number]  HPI New pt to me and our division, here today to establish care Reviewed chronic medical issues:  Dyslipidemia - on statin since 2008, last dose increase 2009. Takes only 1/2 tab (20mg  total daily) but insists her insurance "knows and is ok with me filling 40mg  tabs to save money"  Chronic pain syndrome - followed by pain mgmt [facility]) for same. symptoms  related to shoulder and neck pain - upcoming surgery this week for cervical cause of pain - also prior spinal cord stimulator placement  allergic rhinitis - hx septal surgery - on allergy shots x 4 years without significant improved symptoms so stopped same 1998 - the patient reports compliance with medication(s) as prescribed. Denies adverse side effects.   Also requests genetic testing for breast ca BRCA2 due to FH same mutation  Past Medical History  Diagnosis Date  . Chronic pain syndrome     dr [facility] - pain mgmt  . OA (osteoarthritis)     multiple joints  . Allergic rhinitis, cause unspecified   . Hyperlipidemia   . GERD (gastroesophageal reflux disease)   . Migraines    Family History  Problem Relation Age of Onset  . Arthritis Mother   . Arthritis Father   . Hyperlipidemia Father   . Heart disease Father   . Heart disease Sister   . Arthritis Other   . Ovarian cancer Other   . Prostate cancer Other   . Breast cancer Other    History  Substance Use Topics  . Smoking status: Former Games developer  . Smokeless tobacco: Not on file  . Alcohol Use: No    Review of Systems Constitutional: Negative for fever or weight change.  Respiratory: Negative for cough and shortness of breath.   Cardiovascular: Negative for chest pain or palpitations.  Gastrointestinal: Negative for abdominal pain, no bowel changes.  Musculoskeletal: Negative for gait problem or joint swelling.  Skin: Negative for rash.  Neurological: Negative  for dizziness or headache.  No other specific complaints in a complete review of systems (except as listed in HPI above).     Objective:   Physical Exam BP 110/82  Pulse 89  Temp 97.1 F (36.2 C) (Oral)  Ht 5' 3.5" (1.613 m)  Wt 155 lb 6.4 oz (70.489 kg)  BMI 27.10 kg/m2  SpO2 96% Wt Readings from Last 3 Encounters:  11/18/11 155 lb 6.4 oz (70.489 kg)  11/17/11 155 lb 6.4 oz (70.489 kg)  11/05/11 150 lb (68.04 kg)   Constitutional: She appears well-developed and well-nourished. No distress.  HENT: Head: Normocephalic and atraumatic. Ears: B TMs ok, no erythema or effusion; Nose: Nose normal. Mouth/Throat: Oropharynx is clear and moist. No oropharyngeal exudate.  Eyes: Conjunctivae and EOM are normal. Pupils are equal, round, and reactive to light. No scleral icterus.  Neck: Normal range of motion. Neck supple. No JVD present. No thyromegaly present.  Cardiovascular: Normal rate, regular rhythm and normal heart sounds.  No murmur heard. No BLE edema. Pulmonary/Chest: Effort normal and breath sounds normal. No respiratory distress. She has no wheezes.  Abdominal: Soft. Bowel sounds are normal. She exhibits no distension. There is no tenderness. no masses Musculoskeletal: Normal range of motion, no joint effusions. No gross deformities Neurological: She is alert and oriented to person, place, and time. No cranial nerve deficit. Coordination normal.  Skin: Skin is warm and dry. No rash  noted. No erythema.  Psychiatric: She has a normal mood and affect. Her behavior is normal. Judgment and thought content normal.   Lab Results  Component Value Date   WBC 8.6 11/17/2011   HGB 13.6 11/17/2011   HCT 39.9 11/17/2011   PLT 351 11/17/2011   CHOL 129 03/03/2011   TRIG 97 03/03/2011   HDL 51 03/03/2011   LDLCALC 59 03/03/2011   ALT 23 03/03/2011   AST 21 03/03/2011   NA 140 03/03/2011   K 4.2 03/03/2011   CREATININE 0.9 03/03/2011   BUN 12 03/03/2011   HGBA1C 5.8 04/29/2009        Assessment  & Plan:  See problem list. Medications and labs reviewed today.  Time spent with pt today 45 minutes, greater than 50% time spent counseling patient on breast cancer risk (felt to be low but pt requests testing for BRCA2 due to FH same gene mutation), pain syndrome and medication review. Also review of prior records

## 2011-11-18 NOTE — Assessment & Plan Note (Signed)
On crestor since 2008 - last dose increase 2009 Checks lipids q6mo - will order same now and consider q12mo if stable values and dose  

## 2011-11-18 NOTE — Patient Instructions (Signed)
It was good to see you today. We have reviewed your prior records including labs and tests today Test(s) ordered today. Your results will be called to you after review (48-72hours after test completion). If any changes need to be made, you will be notified at that time. Medications reviewed, no changes at this time. Good luck with your surgery! Please schedule followup in 6 months for cholesterol check, call sooner if problems.

## 2011-11-19 DIAGNOSIS — G894 Chronic pain syndrome: Secondary | ICD-10-CM | POA: Insufficient documentation

## 2011-11-19 MED ORDER — CEFAZOLIN SODIUM 1-5 GM-% IV SOLN
1.0000 g | INTRAVENOUS | Status: AC
  Administered 2011-11-20: 2 g via INTRAVENOUS
  Filled 2011-11-19: qty 50

## 2011-11-19 NOTE — Assessment & Plan Note (Signed)
Mgmt by Dr Phillips and various orthopedists Hx, meds and procedures reviewed The current medical regimen is effective;  continue present plan and medications.   

## 2011-11-19 NOTE — Assessment & Plan Note (Signed)
The current medical regimen is effective;  continue present plan and medications.  

## 2011-11-20 ENCOUNTER — Encounter (HOSPITAL_COMMUNITY): Payer: Self-pay | Admitting: Anesthesiology

## 2011-11-20 ENCOUNTER — Ambulatory Visit (HOSPITAL_COMMUNITY): Payer: BC Managed Care – PPO

## 2011-11-20 ENCOUNTER — Encounter (HOSPITAL_COMMUNITY)
Admission: RE | Disposition: A | Discharge status: Home / Self Care | Payer: Self-pay | Source: Ambulatory Visit | Attending: Neurosurgery

## 2011-11-20 ENCOUNTER — Encounter (HOSPITAL_COMMUNITY): Payer: Self-pay | Admitting: *Deleted

## 2011-11-20 ENCOUNTER — Ambulatory Visit (HOSPITAL_COMMUNITY): Payer: BC Managed Care – PPO | Admitting: Anesthesiology

## 2011-11-20 ENCOUNTER — Ambulatory Visit (HOSPITAL_COMMUNITY)
Admission: RE | Admit: 2011-11-20 | Discharge: 2011-11-21 | Disposition: A | Discharge status: Home / Self Care | Payer: BC Managed Care – PPO | Source: Ambulatory Visit | Attending: Neurosurgery | Admitting: Neurosurgery

## 2011-11-20 DIAGNOSIS — K219 Gastro-esophageal reflux disease without esophagitis: Secondary | ICD-10-CM | POA: Insufficient documentation

## 2011-11-20 DIAGNOSIS — Z01812 Encounter for preprocedural laboratory examination: Secondary | ICD-10-CM | POA: Insufficient documentation

## 2011-11-20 DIAGNOSIS — M5020 Other cervical disc displacement, unspecified cervical region: Secondary | ICD-10-CM | POA: Insufficient documentation

## 2011-11-20 HISTORY — PX: ANTERIOR CERVICAL DECOMP/DISCECTOMY FUSION: SHX1161

## 2011-11-20 SURGERY — ANTERIOR CERVICAL DECOMPRESSION/DISCECTOMY FUSION 1 LEVEL
Anesthesia: General | Site: Neck | Wound class: Clean

## 2011-11-20 MED ORDER — SENNA 8.6 MG PO TABS: 1.0000 | ORAL_TABLET | Freq: Every day | ORAL | Status: DC | PRN

## 2011-11-20 MED ORDER — FLEET ENEMA 7-19 GM/118ML RE ENEM
1.0000 | ENEMA | Freq: Once | RECTAL | Status: AC | PRN
  Filled 2011-11-20: qty 1

## 2011-11-20 MED ORDER — PANTOPRAZOLE SODIUM 40 MG PO TBEC
40.0000 mg | DELAYED_RELEASE_TABLET | Freq: Every day | ORAL | Status: DC
Start: 2011-11-21 — End: 2011-11-21

## 2011-11-20 MED ORDER — NEOSTIGMINE METHYLSULFATE 1 MG/ML IJ SOLN
INTRAMUSCULAR | Status: DC | PRN
  Administered 2011-11-20: 4 mg via INTRAVENOUS

## 2011-11-20 MED ORDER — HETASTARCH-ELECTROLYTES 6 % IV SOLN
INTRAVENOUS | Status: DC | PRN
  Administered 2011-11-20: 14:00:00 via INTRAVENOUS

## 2011-11-20 MED ORDER — ASPIRIN EC 81 MG PO TBEC
81.0000 mg | DELAYED_RELEASE_TABLET | Freq: Every day | ORAL | Status: DC
  Filled 2011-11-20: qty 1

## 2011-11-20 MED ORDER — MORPHINE SULFATE 2 MG/ML IJ SOLN
1.0000 mg | INTRAMUSCULAR | Status: DC | PRN
  Administered 2011-11-20 – 2011-11-21 (×3): 4 mg via INTRAVENOUS
  Filled 2011-11-20 (×3): qty 2

## 2011-11-20 MED ORDER — ACETAMINOPHEN 325 MG PO TABS: 650.0000 mg | ORAL_TABLET | ORAL | Status: DC | PRN

## 2011-11-20 MED ORDER — ONDANSETRON HCL 4 MG/2ML IJ SOLN
4.0000 mg | INTRAMUSCULAR | Status: DC | PRN
  Administered 2011-11-20: 4 mg via INTRAVENOUS
  Filled 2011-11-20: qty 2

## 2011-11-20 MED ORDER — ATORVASTATIN CALCIUM 10 MG PO TABS
10.0000 mg | ORAL_TABLET | Freq: Every day | ORAL | Status: DC
  Filled 2011-11-20 (×2): qty 1

## 2011-11-20 MED ORDER — SODIUM CHLORIDE 0.9 % IR SOLN
Status: DC | PRN
  Administered 2011-11-20: 14:00:00

## 2011-11-20 MED ORDER — 0.9 % SODIUM CHLORIDE (POUR BTL) OPTIME
TOPICAL | Status: DC | PRN
  Administered 2011-11-20: 1000 mL

## 2011-11-20 MED ORDER — BISACODYL 10 MG RE SUPP: 10.0000 mg | Freq: Every day | RECTAL | Status: DC | PRN

## 2011-11-20 MED ORDER — NAPROXEN 500 MG PO TABS
500.0000 mg | ORAL_TABLET | Freq: Two times a day (BID) | ORAL | Status: DC
  Filled 2011-11-20 (×4): qty 1

## 2011-11-20 MED ORDER — LIDOCAINE HCL 4 % MT SOLN
OROMUCOSAL | Status: DC | PRN
  Administered 2011-11-20: 4 mL via TOPICAL

## 2011-11-20 MED ORDER — DOCUSATE SODIUM 100 MG PO CAPS
100.0000 mg | ORAL_CAPSULE | Freq: Two times a day (BID) | ORAL | Status: DC
  Administered 2011-11-20: 100 mg via ORAL
  Filled 2011-11-20: qty 1

## 2011-11-20 MED ORDER — HYDROMORPHONE HCL PF 1 MG/ML IJ SOLN
INTRAMUSCULAR | Status: AC
  Administered 2011-11-20: 0.5 mg via INTRAVENOUS
  Filled 2011-11-20: qty 1

## 2011-11-20 MED ORDER — MENTHOL 3 MG MT LOZG: 1.0000 | LOZENGE | OROMUCOSAL | Status: DC | PRN

## 2011-11-20 MED ORDER — DIAZEPAM 5 MG PO TABS
5.0000 mg | ORAL_TABLET | Freq: Three times a day (TID) | ORAL | Status: DC
  Administered 2011-11-20: 5 mg via ORAL
  Filled 2011-11-20: qty 1

## 2011-11-20 MED ORDER — SENNOSIDES-DOCUSATE SODIUM 8.6-50 MG PO TABS
1.0000 | ORAL_TABLET | Freq: Every evening | ORAL | Status: DC | PRN
  Filled 2011-11-20: qty 1

## 2011-11-20 MED ORDER — THROMBIN 5000 UNITS EX KIT
PACK | CUTANEOUS | Status: DC | PRN
  Administered 2011-11-20 (×2): 5000 [IU] via TOPICAL

## 2011-11-20 MED ORDER — MIDAZOLAM HCL 5 MG/5ML IJ SOLN
INTRAMUSCULAR | Status: DC | PRN
  Administered 2011-11-20: 2 mg via INTRAVENOUS

## 2011-11-20 MED ORDER — HEMOSTATIC AGENTS (NO CHARGE) OPTIME
TOPICAL | Status: DC | PRN
  Administered 2011-11-20: 1 via TOPICAL

## 2011-11-20 MED ORDER — VITAMIN D3 25 MCG (1000 UNIT) PO TABS
6000.0000 [IU] | ORAL_TABLET | Freq: Every day | ORAL | Status: DC
Start: 2011-11-21 — End: 2011-11-21
  Filled 2011-11-20: qty 6

## 2011-11-20 MED ORDER — DROPERIDOL 2.5 MG/ML IJ SOLN: 0.6250 mg | INTRAMUSCULAR | Status: DC | PRN

## 2011-11-20 MED ORDER — LORATADINE 10 MG PO TABS
10.0000 mg | ORAL_TABLET | Freq: Every day | ORAL | Status: DC
  Filled 2011-11-20: qty 1

## 2011-11-20 MED ORDER — LIDOCAINE-EPINEPHRINE 1 %-1:100000 IJ SOLN
INTRAMUSCULAR | Status: DC | PRN
  Administered 2011-11-20: 4 mL

## 2011-11-20 MED ORDER — DIAZEPAM 5 MG PO TABS
5.0000 mg | ORAL_TABLET | Freq: Four times a day (QID) | ORAL | Status: DC | PRN
  Administered 2011-11-20 – 2011-11-21 (×2): 5 mg via ORAL
  Filled 2011-11-20: qty 1

## 2011-11-20 MED ORDER — DIPHENHYDRAMINE HCL 25 MG PO TABS
50.0000 mg | ORAL_TABLET | Freq: Every day | ORAL | Status: DC
Start: 2011-11-20 — End: 2011-11-21
  Administered 2011-11-20: 50 mg via ORAL
  Filled 2011-11-20 (×2): qty 2
  Filled 2011-11-20: qty 1

## 2011-11-20 MED ORDER — DIAZEPAM 5 MG PO TABS
ORAL_TABLET | ORAL | Status: AC
  Filled 2011-11-20: qty 1

## 2011-11-20 MED ORDER — OXYCODONE HCL 5 MG PO TABS
10.0000 mg | ORAL_TABLET | ORAL | Status: DC | PRN
  Administered 2011-11-20: 10 mg via ORAL

## 2011-11-20 MED ORDER — EPHEDRINE SULFATE 50 MG/ML IJ SOLN
INTRAMUSCULAR | Status: DC | PRN
  Administered 2011-11-20: 5 mg via INTRAVENOUS
  Administered 2011-11-20 (×2): 10 mg via INTRAVENOUS
  Administered 2011-11-20: 5 mg via INTRAVENOUS
  Administered 2011-11-20: 15 mg via INTRAVENOUS

## 2011-11-20 MED ORDER — DEXAMETHASONE SODIUM PHOSPHATE 4 MG/ML IJ SOLN
INTRAMUSCULAR | Status: DC | PRN
  Administered 2011-11-20: 4 mg via INTRAVENOUS
  Administered 2011-11-20: 6 mg via INTRAVENOUS

## 2011-11-20 MED ORDER — OXYCODONE HCL 5 MG PO TABS
ORAL_TABLET | ORAL | Status: AC
  Filled 2011-11-20: qty 2

## 2011-11-20 MED ORDER — AZELASTINE HCL 0.1 % NA SOLN
1.0000 | Freq: Two times a day (BID) | NASAL | Status: DC | PRN
  Filled 2011-11-20: qty 30

## 2011-11-20 MED ORDER — OMEGA-3-ACID ETHYL ESTERS 1 G PO CAPS
1.0000 g | ORAL_CAPSULE | Freq: Every day | ORAL | Status: DC
  Filled 2011-11-20: qty 1

## 2011-11-20 MED ORDER — AMITRIPTYLINE HCL 50 MG PO TABS
50.0000 mg | ORAL_TABLET | Freq: Every day | ORAL | Status: DC
  Administered 2011-11-20: 50 mg via ORAL
  Filled 2011-11-20 (×2): qty 1

## 2011-11-20 MED ORDER — OXYCODONE-ACETAMINOPHEN 5-325 MG PO TABS
1.0000 | ORAL_TABLET | ORAL | Status: DC | PRN
  Administered 2011-11-20 – 2011-11-21 (×3): 2 via ORAL
  Filled 2011-11-20 (×3): qty 2

## 2011-11-20 MED ORDER — CEFAZOLIN SODIUM 1-5 GM-% IV SOLN
1.0000 g | Freq: Three times a day (TID) | INTRAVENOUS | Status: AC
  Administered 2011-11-20 – 2011-11-21 (×2): 1 g via INTRAVENOUS
  Filled 2011-11-20 (×2): qty 50

## 2011-11-20 MED ORDER — OXYCODONE-ACETAMINOPHEN 7.5-325 MG PO TABS: 1.0000 | ORAL_TABLET | Freq: Two times a day (BID) | ORAL | Status: DC | PRN

## 2011-11-20 MED ORDER — SODIUM CHLORIDE 0.9 % IJ SOLN: 3.0000 mL | Freq: Two times a day (BID) | INTRAMUSCULAR | Status: DC

## 2011-11-20 MED ORDER — ARTIFICIAL TEARS OP OINT
TOPICAL_OINTMENT | OPHTHALMIC | Status: DC | PRN
  Administered 2011-11-20: 1 via OPHTHALMIC

## 2011-11-20 MED ORDER — HYDROCODONE-ACETAMINOPHEN 5-325 MG PO TABS: 1.0000 | ORAL_TABLET | ORAL | Status: DC | PRN

## 2011-11-20 MED ORDER — OXYCODONE-ACETAMINOPHEN 5-325 MG PO TABS: 1.5000 | ORAL_TABLET | Freq: Two times a day (BID) | ORAL | Status: DC | PRN

## 2011-11-20 MED ORDER — ZOLPIDEM TARTRATE 5 MG PO TABS: 5.0000 mg | ORAL_TABLET | Freq: Every evening | ORAL | Status: DC | PRN

## 2011-11-20 MED ORDER — BUPIVACAINE HCL (PF) 0.5 % IJ SOLN
INTRAMUSCULAR | Status: DC | PRN
  Administered 2011-11-20: 4 mL

## 2011-11-20 MED ORDER — FENTANYL CITRATE 0.05 MG/ML IJ SOLN
INTRAMUSCULAR | Status: DC | PRN
  Administered 2011-11-20: 50 ug via INTRAVENOUS
  Administered 2011-11-20 (×2): 100 ug via INTRAVENOUS
  Administered 2011-11-20 (×2): 50 ug via INTRAVENOUS
  Administered 2011-11-20: 100 ug via INTRAVENOUS

## 2011-11-20 MED ORDER — LACTATED RINGERS IV SOLN
INTRAVENOUS | Status: DC | PRN
  Administered 2011-11-20 (×3): via INTRAVENOUS

## 2011-11-20 MED ORDER — BISACODYL 5 MG PO TBEC
5.0000 mg | DELAYED_RELEASE_TABLET | Freq: Every day | ORAL | Status: DC | PRN
  Filled 2011-11-20: qty 1

## 2011-11-20 MED ORDER — ONDANSETRON HCL 4 MG/2ML IJ SOLN
INTRAMUSCULAR | Status: DC | PRN
  Administered 2011-11-20: 4 mg via INTRAVENOUS

## 2011-11-20 MED ORDER — ROCURONIUM BROMIDE 100 MG/10ML IV SOLN
INTRAVENOUS | Status: DC | PRN
  Administered 2011-11-20: 50 mg via INTRAVENOUS
  Administered 2011-11-20: 10 mg via INTRAVENOUS

## 2011-11-20 MED ORDER — SODIUM CHLORIDE 0.9 % IJ SOLN: 3.0000 mL | INTRAMUSCULAR | Status: DC | PRN

## 2011-11-20 MED ORDER — KCL IN DEXTROSE-NACL 20-5-0.45 MEQ/L-%-% IV SOLN
INTRAVENOUS | Status: DC
  Filled 2011-11-20 (×3): qty 1000

## 2011-11-20 MED ORDER — CEFAZOLIN SODIUM 1-5 GM-% IV SOLN
INTRAVENOUS | Status: AC
  Filled 2011-11-20: qty 50

## 2011-11-20 MED ORDER — PHENYLEPHRINE HCL 10 MG/ML IJ SOLN
INTRAMUSCULAR | Status: DC | PRN
  Administered 2011-11-20 (×3): 80 ug via INTRAVENOUS
  Administered 2011-11-20: 40 ug via INTRAVENOUS
  Administered 2011-11-20 (×3): 80 ug via INTRAVENOUS

## 2011-11-20 MED ORDER — PHENYLEPHRINE HCL 10 MG/ML IJ SOLN
10.0000 mg | INTRAVENOUS | Status: DC | PRN
  Administered 2011-11-20: 25 ug/min via INTRAVENOUS

## 2011-11-20 MED ORDER — BACITRACIN 50000 UNITS IM SOLR
INTRAMUSCULAR | Status: AC
  Filled 2011-11-20: qty 1

## 2011-11-20 MED ORDER — ACETAMINOPHEN 650 MG RE SUPP: 650.0000 mg | RECTAL | Status: DC | PRN

## 2011-11-20 MED ORDER — HYDROMORPHONE HCL PF 1 MG/ML IJ SOLN
0.5000 mg | INTRAMUSCULAR | Status: AC | PRN
  Administered 2011-11-20 (×4): 0.5 mg via INTRAVENOUS

## 2011-11-20 MED ORDER — FLUTICASONE PROPIONATE 50 MCG/ACT NA SUSP
2.0000 | Freq: Every day | NASAL | Status: DC
  Filled 2011-11-20: qty 16

## 2011-11-20 MED ORDER — SODIUM CHLORIDE 0.9 % IV SOLN
INTRAVENOUS | Status: AC
  Filled 2011-11-20: qty 500

## 2011-11-20 MED ORDER — PHENOL 1.4 % MT LIQD: 1.0000 | OROMUCOSAL | Status: DC | PRN

## 2011-11-20 MED ORDER — HYDROMORPHONE HCL PF 1 MG/ML IJ SOLN
0.2500 mg | INTRAMUSCULAR | Status: DC | PRN
  Administered 2011-11-20 (×4): 0.5 mg via INTRAVENOUS

## 2011-11-20 MED ORDER — PROPOFOL 10 MG/ML IV EMUL
INTRAVENOUS | Status: DC | PRN
  Administered 2011-11-20: 200 mg via INTRAVENOUS

## 2011-11-20 MED ORDER — GLYCOPYRROLATE 0.2 MG/ML IJ SOLN
INTRAMUSCULAR | Status: DC | PRN
  Administered 2011-11-20: .6 mg via INTRAVENOUS

## 2011-11-20 MED ORDER — VITAMIN D 50 MCG (2000 UT) PO CAPS: 6000.0000 [IU] | ORAL_CAPSULE | Freq: Every morning | ORAL | Status: DC

## 2011-11-20 SURGICAL SUPPLY — 72 items
ADH SKN CLS APL DERMABOND .7 (GAUZE/BANDAGES/DRESSINGS) ×1
APL SKNCLS STERI-STRIP NONHPOA (GAUZE/BANDAGES/DRESSINGS)
BAG DECANTER FOR FLEXI CONT (MISCELLANEOUS) ×2 IMPLANT
BANDAGE GAUZE ELAST BULKY 4 IN (GAUZE/BANDAGES/DRESSINGS) ×2 IMPLANT
BENZOIN TINCTURE PRP APPL 2/3 (GAUZE/BANDAGES/DRESSINGS) IMPLANT
BIT DRILL 2.3 12 FIXED (INSTRUMENTS) IMPLANT
BIT DRILL NEURO 2X3.1 SFT TUCH (MISCELLANEOUS) ×1 IMPLANT
BLADE ULTRA TIP 2M (BLADE) ×1 IMPLANT
BUR BARREL STRAIGHT FLUTE 4.0 (BURR) ×2 IMPLANT
CAGE CERVICAL 8 (Cage) ×2 IMPLANT
CANISTER SUCTION 2500CC (MISCELLANEOUS) ×2 IMPLANT
CLOTH BEACON ORANGE TIMEOUT ST (SAFETY) ×2 IMPLANT
CONT SPEC 4OZ CLIKSEAL STRL BL (MISCELLANEOUS) ×2 IMPLANT
COVER MAYO STAND STRL (DRAPES) ×2 IMPLANT
DERMABOND ADVANCED (GAUZE/BANDAGES/DRESSINGS) ×1
DERMABOND ADVANCED .7 DNX12 (GAUZE/BANDAGES/DRESSINGS) ×1 IMPLANT
DRAPE LAPAROTOMY 100X72 PEDS (DRAPES) ×2 IMPLANT
DRAPE MICROSCOPE LEICA (MISCELLANEOUS) ×2 IMPLANT
DRAPE POUCH INSTRU U-SHP 10X18 (DRAPES) ×2 IMPLANT
DRAPE PROXIMA HALF (DRAPES) IMPLANT
DRESSING TELFA 8X3 (GAUZE/BANDAGES/DRESSINGS) IMPLANT
DRILL 12MM (INSTRUMENTS) ×2
DRILL NEURO 2X3.1 SOFT TOUCH (MISCELLANEOUS) ×2
DURAPREP 6ML APPLICATOR 50/CS (WOUND CARE) ×2 IMPLANT
ELECT COATED BLADE 2.86 ST (ELECTRODE) ×2 IMPLANT
ELECT REM PT RETURN 9FT ADLT (ELECTROSURGICAL) ×2
ELECTRODE REM PT RTRN 9FT ADLT (ELECTROSURGICAL) ×1 IMPLANT
GAUZE SPONGE 4X4 16PLY XRAY LF (GAUZE/BANDAGES/DRESSINGS) IMPLANT
GLOVE BIO SURGEON STRL SZ8 (GLOVE) ×2 IMPLANT
GLOVE BIOGEL PI IND STRL 8 (GLOVE) ×1 IMPLANT
GLOVE BIOGEL PI IND STRL 8.5 (GLOVE) ×1 IMPLANT
GLOVE BIOGEL PI INDICATOR 8 (GLOVE) ×2
GLOVE BIOGEL PI INDICATOR 8.5 (GLOVE) ×1
GLOVE ECLIPSE 7.5 STRL STRAW (GLOVE) ×4 IMPLANT
GLOVE EXAM NITRILE LRG STRL (GLOVE) IMPLANT
GLOVE EXAM NITRILE MD LF STRL (GLOVE) IMPLANT
GLOVE EXAM NITRILE XL STR (GLOVE) IMPLANT
GLOVE EXAM NITRILE XS STR PU (GLOVE) IMPLANT
GOWN BRE IMP SLV AUR LG STRL (GOWN DISPOSABLE) ×2 IMPLANT
GOWN BRE IMP SLV AUR XL STRL (GOWN DISPOSABLE) ×1 IMPLANT
GOWN STRL REIN 2XL LVL4 (GOWN DISPOSABLE) ×1 IMPLANT
HEAD HALTER (SOFTGOODS) ×2 IMPLANT
KIT BASIN OR (CUSTOM PROCEDURE TRAY) ×2 IMPLANT
KIT ROOM TURNOVER OR (KITS) ×2 IMPLANT
NDL HYPO 18GX1.5 BLUNT FILL (NEEDLE) ×1 IMPLANT
NDL HYPO 25X1 1.5 SAFETY (NEEDLE) ×1 IMPLANT
NDL SPNL 22GX3.5 QUINCKE BK (NEEDLE) ×1 IMPLANT
NEEDLE HYPO 18GX1.5 BLUNT FILL (NEEDLE) IMPLANT
NEEDLE HYPO 25X1 1.5 SAFETY (NEEDLE) ×2 IMPLANT
NEEDLE SPNL 22GX3.5 QUINCKE BK (NEEDLE) ×2 IMPLANT
NS IRRIG 1000ML POUR BTL (IV SOLUTION) ×2 IMPLANT
PACK LAMINECTOMY NEURO (CUSTOM PROCEDURE TRAY) ×2 IMPLANT
PAD ARMBOARD 7.5X6 YLW CONV (MISCELLANEOUS) ×6 IMPLANT
PIN DISTRACTION 14MM (PIN) ×4 IMPLANT
PLATE 14MM (Plate) ×1 IMPLANT
PROFUSE BONE SIZE 2 (Bone Implant) ×1 IMPLANT
RUBBERBAND STERILE (MISCELLANEOUS) ×2 IMPLANT
SCREW 12MM (Screw) ×4 IMPLANT
SPACER SPNL 7D LRG 16X14X8XNS (Cage) IMPLANT
SPCR SPNL 7D LRG 16X14X8XNS (Cage) ×1 IMPLANT
SPONGE GAUZE 4X4 12PLY (GAUZE/BANDAGES/DRESSINGS) IMPLANT
SPONGE INTESTINAL PEANUT (DISPOSABLE) ×2 IMPLANT
SPONGE SURGIFOAM ABS GEL SZ50 (HEMOSTASIS) ×1 IMPLANT
STAPLER SKIN PROX WIDE 3.9 (STAPLE) IMPLANT
STRIP CLOSURE SKIN 1/2X4 (GAUZE/BANDAGES/DRESSINGS) IMPLANT
SUT VIC AB 3-0 SH 8-18 (SUTURE) ×2 IMPLANT
SYR 20ML ECCENTRIC (SYRINGE) ×2 IMPLANT
SYR 3ML LL SCALE MARK (SYRINGE) ×1 IMPLANT
TOWEL OR 17X24 6PK STRL BLUE (TOWEL DISPOSABLE) ×2 IMPLANT
TOWEL OR 17X26 10 PK STRL BLUE (TOWEL DISPOSABLE) ×2 IMPLANT
TRAP SPECIMEN MUCOUS 40CC (MISCELLANEOUS) ×2 IMPLANT
WATER STERILE IRR 1000ML POUR (IV SOLUTION) ×2 IMPLANT

## 2011-11-20 NOTE — Anesthesia Preprocedure Evaluation (Signed)
Anesthesia Evaluation  Patient identified by MRN, date of birth, ID band Patient awake    Reviewed: Allergy & Precautions, H&P , NPO status , Patient's Chart, lab work & pertinent test results  Airway Mallampati: I TM Distance: >3 FB Neck ROM: Full    Dental  (+) Teeth Intact and Dental Advisory Given   Pulmonary neg pulmonary ROS,  breath sounds clear to auscultation  Pulmonary exam normal       Cardiovascular negative cardio ROS  Rhythm:Regular Rate:Normal     Neuro/Psych    GI/Hepatic Neg liver ROS, GERD-  Medicated,  Endo/Other  negative endocrine ROS  Renal/GU negative Renal ROS     Musculoskeletal   Abdominal   Peds  Hematology   Anesthesia Other Findings   Reproductive/Obstetrics                           Anesthesia Physical Anesthesia Plan  ASA: II  Anesthesia Plan: General   Post-op Pain Management:    Induction: Intravenous  Airway Management Planned: Oral ETT  Additional Equipment:   Intra-op Plan:   Post-operative Plan: Extubation in OR  Informed Consent: I have reviewed the patients History and Physical, chart, labs and discussed the procedure including the risks, benefits and alternatives for the proposed anesthesia with the patient or authorized representative who has indicated his/her understanding and acceptance.   Dental advisory given  Plan Discussed with: CRNA, Anesthesiologist and Surgeon  Anesthesia Plan Comments:         Anesthesia Quick Evaluation

## 2011-11-20 NOTE — Plan of Care (Signed)
Problem: Consults Goal: Diagnosis - Spinal Surgery Outcome: Completed/Met Date Met:  11/20/11 Cervical Spine Fusion     

## 2011-11-20 NOTE — Anesthesia Postprocedure Evaluation (Signed)
Anesthesia Post Note  Patient: Lisa Arnold  Procedure(s) Performed: Procedure(s) (LRB): ANTERIOR CERVICAL DECOMPRESSION/DISCECTOMY FUSION 1 LEVEL (N/A)  Anesthesia type: general  Patient location: PACU  Post pain: Pain level controlled  Post assessment: Patient's Cardiovascular Status Stable  Last Vitals:  Filed Vitals:   11/20/11 1600  BP: 124/73  Pulse: 87  Temp:   Resp: 16    Post vital signs: Reviewed and stable  Level of consciousness: sedated  Complications: No apparent anesthesia complications

## 2011-11-20 NOTE — Progress Notes (Signed)
Patient awake, alert, conversant.  Full strength bilateral upper extremities with improved arm pain.  MAEW.  Doing well.

## 2011-11-20 NOTE — Transfer of Care (Signed)
Immediate Anesthesia Transfer of Care Note  Patient: Lisa Arnold  Procedure(s) Performed: Procedure(s) (LRB): ANTERIOR CERVICAL DECOMPRESSION/DISCECTOMY FUSION 1 LEVEL (N/A)  Patient Location: PACU  Anesthesia Type: General  Level of Consciousness: awake, alert  and oriented  Airway & Oxygen Therapy: Patient Spontanous Breathing and Patient connected to nasal cannula oxygen  Post-op Assessment: Report given to PACU RN, Post -op Vital signs reviewed and stable and Patient moving all extremities  Post vital signs: Reviewed and stable  Complications: No apparent anesthesia complications

## 2011-11-20 NOTE — Op Note (Signed)
11/20/2011  4:14 PM  PATIENT:  Lisa Arnold  51 y.o. female  PRE-OPERATIVE DIAGNOSIS:  Cervical five-six herniated nucleus pulposus, spondylosis, neck pain and radiculopathy  POST-OPERATIVE DIAGNOSIS: Cervical five-six herniated nucleus pulposus, spondylosis, neck pain and radiculopathy  PROCEDURE:  Procedure(s) (LRB): ANTERIOR CERVICAL DECOMPRESSION/DISCECTOMY FUSION 1 LEVEL with PEEK cage, autograft, allograft, plate Y8/6 (N/A)  SURGEON:  Surgeon(s) and Role:    * Maeola Harman, MD - Primary    * Reinaldo Meeker, MD - Assisting  PHYSICIAN ASSISTANT:   ASSISTANTS: none   ANESTHESIA:   general  EBL:  Total I/O In: 2500 [I.V.:2000; IV Piggyback:500] Out: -   BLOOD ADMINISTERED:none  DRAINS: none   LOCAL MEDICATIONS USED:  LIDOCAINE   SPECIMEN:  No Specimen  DISPOSITION OF SPECIMEN:  N/A  COUNTS:  YES  TOURNIQUET:  * No tourniquets in log *  DICTATION: Patient was brought to operating room and following the smooth and uncomplicated induction of general endotracheal anesthesia her head was placed on a horseshoe head holder she was placed in 5 pounds of Holter traction and her anterior neck was prepped and draped in usual sterile fashion. An incision was made on the left side of midline after infiltrating the skin and subcutaneous tissues with local lidocaine. The platysmal layer was incised and subplatysmal dissection was performed exposing the anterior border sternocleidomastoid muscle. Using blunt dissection the carotid sheath was kept lateral and trachea and esophagus kept medial exposing the anterior cervical spine. A bent spinal needle was placed it was felt to be the C5/6 level and this was confirmed on intraoperative x-ray. Longus coli muscles were taken down from the anterior cervical spine using electrocautery and key elevator and self-retaining retractor was placed. The interspace was incised and a thorough discectomy was performed. Distraction pins were placed.  Uncinate spurs and central spondylitic ridges were drilled down with a high-speed drill. There were large osteophytes which were causing significant cord and neuro foraminal compression. This was thoroughly decompressed.  The spinal cord dura and both C6 nerve roots were widely decompressed. Hemostasis was assured. After trial sizing an 8 mm peek interbody cage was selected and packed with profuse block and autograft. Tamped into position and countersunk appropriately. Distraction weight was removed. A 14 mm trestle luxe anterior cervical plate was affixed to the cervical spine with 12 mm variable-angle screws 2 at C5 and 2 at C6. All screws were well-positioned and locking mechanisms were engaged. Soft tissues were inspected and found to be in good repair. Final X-ray demonstrated well positioned interbody graft and anterior cervical plate. The wound was irrigated. The platysma layer was closed with 3-0 Vicryl stitches and the skin was reapproximated with 3-0 Vicryl subcuticular stitches. The wound was dressed with Dermabond. Counts were correct at the end of the case. Patient was extubated and taken to recovery in stable and satisfactory condition.  PLAN OF CARE: Admit for overnight observation  PATIENT DISPOSITION:  PACU - hemodynamically stable.   Delay start of Pharmacological VTE agent (>24hrs) due to surgical blood loss or risk of bleeding: yes

## 2011-11-20 NOTE — Anesthesia Procedure Notes (Signed)
Procedure Name: Intubation Date/Time: 11/20/2011 1:11 PM Performed by: Luster Landsberg Pre-anesthesia Checklist: Patient identified, Emergency Drugs available, Suction available and Patient being monitored Patient Re-evaluated:Patient Re-evaluated prior to inductionOxygen Delivery Method: Circle system utilized Preoxygenation: Pre-oxygenation with 100% oxygen Intubation Type: IV induction Ventilation: Mask ventilation without difficulty and Oral airway inserted - appropriate to patient size Laryngoscope Size: Mac and 4 Grade View: Grade I Tube type: Oral Tube size: 7.0 mm Number of attempts: 1 Airway Equipment and Method: Stylet,  LTA kit utilized and Oral airway Placement Confirmation: ETT inserted through vocal cords under direct vision,  positive ETCO2 and breath sounds checked- equal and bilateral Secured at: 21 cm Tube secured with: Tape Dental Injury: Teeth and Oropharynx as per pre-operative assessment

## 2011-11-20 NOTE — H&P (Signed)
Lisa Arnold #[id number] DOB:  Jan 06, 1961 11/09/2011:  Lisa Arnold comes in today.  I went over her myelogram with her and her husband both of her cervical, thoracic, and lumbar spine.    The lumbar myelogram demonstrates some spinal stenosis at L3-4.  The tip of the electrodes is at L1 in the epidural space.  Mild degenerative changes with a solid fusion at previously operated L4-5 level.  She has mild degenerative changes in her mid-thoracic spine.   Her myelogram of her cervical spine demonstrates a significant spur with foraminal stenosis at C5-6 on the left, which corresponds well to her pain complaints.  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    I have recommended, based on the severity and anatomy of her pain complaints and anatomy of pathologic findings on her imaging studies, that she go ahead with surgical intervention. This will consist of anterior cervical decompression and fusion at the C5-6 level.  She was fitted for a soft cervical collar. We went over the models and attendant risks and benefits of surgery. She wishes to proceed.    I have recommended to the patient that she undergo anterior cervical discectomy and fusion with plating.  I went over the diagnostic studies in detail and reviewed surgical models and also discussed the exact nature of the surgical procedure, attendant risks, potential benefits and typical operative and postoperative course.  I discussed the risks of surgery which include, but are not limited to, risks of anesthesia, blood loss, infection, injury to various neck structures including the trachea, esophagus, which could cause temporary or permanent swallowing difficulties and also the potential for perforation of the esophagus which might require operative intervention, larynx, recurrent laryngeal nerve, which could cause either temporary or permanent vocal cord paralysis resulting in either temporary or permanent voice changes,  injury to cervical nerve roots, which could cause either temporary or permanent arm pain, numbness and/or weakness.  There is a small chance of injury to the spinal cord which could cause paralysis.  There is also the potential for malplacement of instrumentation, fusion failure, need for repeat surgery, degenerative disease at other levels in the neck, failure to relieve the pain, worsening of pain.  I also discussed with the patient that she will lose some neck mobility from the surgery.  It is typical to stay in the hospital overnight after this operation.  Typically she will not be able to drive for two weeks after surgery and will come back to see me two weeks following surgery with a lateral C-spine x-ray and then for monthly visits to three months after surgery.  Generally patients are out of work for four to six weeks following surgery.  She will wear a soft collar for two weeks after surgery.           Lisa Arnold. Venetia Maxon, M.D./[facility]  Lisa Arnold  #[id number]  DOB:  1960/08/03     Oct 07, 2011   HISTORY OF PRESENT ILLNESS:  Lisa Arnold is a 51 year old woman who presents at the request of Thyra Breed for consideration of removal of spinal cord stimulator.  This was placed while she was in [place] in [place] and she had subcutaneous lead, as well as an intracanal lead with a [employer], advertising.  She had microdiskectomy by Dr. Jordan Likes followed by a fusion first in 2001 and then 2002 and then in [place] had the spinal cord stimulator placed initially in 2006 and then revised in 2007.  She says  it is extremely helpful to her and she misses it when it is not on.  Her problem is that she is having significant issues with her left shoulder and that she has seen Dr. Teressa Senter and also Dr. Dion Saucier and Dr. Dion Saucier has suggested an MRI be obtained of her left shoulder to clarify whether she has avascular necrosis or some other structural abnormality which is not identified on other  shoulder imaging.  She says that her shoulder pain is so severe that she finds it difficult to live with and she wants to get some relief of her shoulder pain.    Lisa Arnold is taking Diazepam 5 mg 3 to 4 x daily, Percocet 7.5/325 1 to 3 per day, Nucynta 50 mg 6 to 8 per day.    REVIEW OF SYSTEMS:   Review of Systems was reviewed with the patient.  Pertinent positives include Cardiovascular - she has elevated cholesterol and leg pain with walking; Gastrointestinal - she notes indigestion or pain with eating and ulcers or gastris; Musculoskeletal - she notes leg pain, back pain, joint pain or swelling, arthritis; Allergic/Immunologic - she notes inhalant/nasal allergies.    PAST MEDICAL HISTORY:      Current Medical Conditions:  She has a history of gastritis 2 to 3 x per day, elevated cholesterol and elevated triglycerides.      Prior Operations and Hospitalizations:  Percutaneous leads with subcutaneous lead with Restore and this is a [employer] device.   Sinus and deviated septum surgery by Dr. Ezzard Standing in 1997 and 1999, surgery by Dr. Jordan Likes in 2001 and 2002 and spinal cord stimulator by Dr. Dwyane Luo in 2006 and 2007.  The 2007 surgery was to repair a broken lead.    Medications and Allergies:  Naproxen 500 mg twice daily, Allegra once daily, Pantoprazole 40 qam, Lovaza once qam, Amitriptyline 50 mg 2 po qpm, Baby Aspirin once at noon, Flonase Nasal Spray 2 puffs qam, Crestor 20 mg po qpm, Senokot/Dulcolax 3 po qpm, Diazepam 5 mg 3 to 4 per day, Nucynta 50 mg 6 to 8 per day and Percocet 7.5/325 1 to 3 per day.  She also uses an  Astelin Nasal Spray.  She denies any drug allergies.      Height and Weight:  5', 3 " tall and 150 lbs.    FAMILY HISTORY:    Mother died of a pulmonary embolism and diabetes.  Father died of heart disease.    SOCIAL HISTORY:    She currently is a nonsmoker and nondrinker of alcoholic beverages.  No history of substance abuse.    PHYSICAL EXAMINATION:      General  Appearance:  On examination today Lisa Arnold is a pleasant, cooperative woman in no acute distress.      Blood Pressure, Pulse and Respiratory Rate:  Blood pressure is 130/86.  Heart rate is 74 and regular.  Respiratory rate is 18.        HEENT - normocephalic, atraumatic.  The pupils are equal, round and reactive to light.  The extraocular muscles are intact.  Sclerae - white.  Conjunctiva - pink.  Oropharynx benign.  Uvula midline.     Neck - there are no masses, meningismus, deformities, tracheal deviation, jugular vein distention or carotid bruits.  There is normal cervical range of motion.  Spurlings' test is negative without reproducible radicular pain turning the patient's head to either side.  Lhermitte's sign is not present with axial compression.      Respiratory - there  is normal respiratory effort with good intercostal function.  Lungs are clear to auscultation.  There are no rales, rhonchi or wheezes.      Cardiovascular - the heart has regular rate and rhythm to auscultation.  No murmurs are appreciated.  There is no extremity edema, cyanosis or clubbing.  There are palpable pedal pulses.      Abdomen - soft, nontender, no hepatosplenomegaly appreciated or masses.  There are active bowel sounds.  No guarding or rebound.   Musculoskeletal Examination - she has healed lumbar incision and also her implantable pulse generator has been tunneled with extensions to the lower anterior abdomen on the left.  She has significant left shoulder pain to palpation and decreased range of motion secondary to her significant discomfort.    NEUROLOGICAL EXAMINATION: The patient is oriented to time, person and place and has good recall of both recent and remote memory with normal attention span and concentration.  The patient speaks with clear and fluent speech and exhibits normal language function and appropriate fund of knowledge.      Cranial Nerve Examination - pupils are equal, round and reactive to  light.  Extraocular movements are full.  Visual fields are full to confrontational testing.  Facial sensation and facial movement are symmetric and intact.  Hearing is intact to finger rub.  Palate is upgoing.  Shoulder shrug is symmetric.  Tongue protrudes in the midline.      Motor Examination - motor strength is 5/5 in the bilateral deltoids, biceps, triceps, handgrips, wrist extensors, interosseous.  In the lower extremities motor strength is 5/5 in hip flexion, extension, quadriceps, hamstrings, plantar flexion, dorsiflexion and extensor hallucis longus.      Sensory Examination - normal to light touch and pinprick sensation in the upper and lower extremities.     Deep Tendon Reflexes - 2 in the biceps, triceps and brachioradialis, 2 at the knees, 2 at ankles.  Great toes are downgoing to plantar stimulation.      Cerebellar Examination - normal coordination in upper and lower extremities and normal rapid alternating movements.  Romberg test is negative.    IMPRESSION AND RECOMMENDATIONS:   Shay Jhaveri is a 51 year old woman with chronic pain both in her low back and in her left shoulder.  She wants to have workup of her shoulder pain and is considering having her spinal cord stimulator removed to do so.  I told her I could certainly do this, but I thought that was a very drastic decision to make in light of the fact that the MRI may be done and may lead to no further treatment and at the same time she is getting significant relief of her back pain with the spinal cord stimulator.  I told her that she should speak with Dr. Dion Saucier about his recommendations and whether or not he absolutely needs to have the device removed before the shoulder evaluation can continue.  She will followup with me if she wishes to have the device removed.  Otherwise, I will plan on seeing her back on an as needed basis.                                         [facility]    Lisa Arnold. Venetia Maxon, M.D.

## 2011-11-21 MED ORDER — HYDROMORPHONE HCL PF 1 MG/ML IJ SOLN: 0.5000 mg | INTRAMUSCULAR | Status: DC | PRN

## 2011-11-21 MED ORDER — HYDROMORPHONE HCL PF 1 MG/ML IJ SOLN
1.0000 mg | INTRAMUSCULAR | Status: DC | PRN
  Administered 2011-11-21 (×3): 1 mg via INTRAVENOUS
  Filled 2011-11-21 (×2): qty 1

## 2011-11-21 MED ORDER — HYDROMORPHONE HCL PF 1 MG/ML IJ SOLN
INTRAMUSCULAR | Status: AC
  Administered 2011-11-21: 1 mg via INTRAVENOUS
  Filled 2011-11-21: qty 1

## 2011-11-21 NOTE — Discharge Instructions (Signed)

## 2011-11-21 NOTE — Discharge Summary (Signed)
Physician Discharge Summary  Patient ID: Lisa Arnold MRN: [id number] DOB/AGE: [birth year] 51 y.o.  Admit date: 11/20/2011 Discharge date: 11/21/2011  Admission Diagnoses:Cervical five-six herniated nucleus pulposus, spondylosis, neck pain and radiculopathy      Discharge Diagnoses: Cervical five-six herniated nucleus pulposus, spondylosis, neck pain and radiculopathy     Active Problems:  * No active hospital problems. *    Discharged Condition: good  Hospital Course: pt admitted on day of surgery - underwent procedure below - pt doing well, less arm pain, voice  Ok, swallowing well  Consults: None  Significant Diagnostic Studies: none  Treatments: surgery: ANTERIOR CERVICAL DECOMPRESSION/DISCECTOMY FUSION 1 LEVEL with PEEK cage, autograft, allograft, plate N8/2 (N/A)   Discharge Exam: Blood pressure 133/78, pulse 100, temperature 98.5 F (36.9 C), temperature source Tympanic, resp. rate 18, SpO2 97.00%. Wound:c/d/i  Disposition: home  Discharge Orders    Future Appointments: Provider: Department: Dept Phone: Center:   05/10/2012 10:30 AM Newt Lukes, MD [facility] [phone] [facility]     Medication List  As of 11/21/2011  8:12 AM   STOP taking these medications         naproxen 500 MG tablet         TAKE these medications         amitriptyline 50 MG tablet   Commonly known as: ELAVIL   Take 50 mg by mouth at bedtime.      aspirin EC 81 MG tablet   Take 81 mg by mouth daily.      azelastine 137 MCG/SPRAY nasal spray   Commonly known as: ASTELIN   Place 1 spray into both nostrils 2 (two) times daily as needed. For allergies.      bisacodyl 5 MG EC tablet   Commonly known as: DULCOLAX   Take 5 mg by mouth daily as needed. For constipation.      CRESTOR 40 MG tablet   Generic drug: rosuvastatin   Take 1 tablet (40 mg total) by mouth daily.      diazepam 5 MG tablet   Commonly known as: VALIUM   Take 5 mg by mouth 3 (three) times daily.      diphenhydrAMINE 25 MG tablet   Commonly known as: BENADRYL   Take 50 mg by mouth at bedtime.      fexofenadine 180 MG tablet   Commonly known as: ALLEGRA   Take 180 mg by mouth daily.      fluticasone 50 MCG/ACT nasal spray   Commonly known as: FLONASE   Place 2 sprays into the nose daily.      NUCYNTA 50 MG Tabs   Generic drug: tapentadol   Take 100 mg by mouth every 4 (four) hours as needed. For pain      omega-3 acid ethyl esters 1 G capsule   Commonly known as: LOVAZA   Take 1 g by mouth daily.      oxyCODONE-acetaminophen 7.5-325 MG per tablet   Commonly known as: PERCOCET   Take 1 tablet by mouth 2 (two) times daily as needed. For break-through pain.      pantoprazole 40 MG tablet   Commonly known as: PROTONIX   Take 40 mg by mouth every morning.      senna 8.6 MG Tabs   Commonly known as: SENOKOT   Take 1 tablet by mouth daily as needed. For constipation.      Vitamin D 2000 UNITS Caps   Take 6,000 Units by mouth every morning.  Signed: Clydene Fake, MD 11/21/2011, 8:12 AM

## 2011-11-23 ENCOUNTER — Encounter (HOSPITAL_COMMUNITY): Payer: Self-pay | Admitting: Neurosurgery

## 2012-01-19 ENCOUNTER — Other Ambulatory Visit: Payer: Self-pay | Admitting: Neurosurgery

## 2012-01-21 ENCOUNTER — Telehealth: Payer: Self-pay | Admitting: *Deleted

## 2012-01-21 NOTE — Telephone Encounter (Signed)
MD received results back from BRCA testing. Results was negative. Called pt no answer LMOM RTC...Raechel Chute

## 2012-01-21 NOTE — Telephone Encounter (Signed)
Called pt again still no answer LMOM RTC...Raechel Chute

## 2012-01-22 NOTE — Telephone Encounter (Signed)
Called pt again still no answer left msg on cell # results negative, also mail copy of report to pt address...Raechel Chute

## 2012-02-08 ENCOUNTER — Encounter: Payer: Self-pay | Admitting: Internal Medicine

## 2012-02-13 ENCOUNTER — Other Ambulatory Visit: Payer: Self-pay | Admitting: Internal Medicine

## 2012-03-01 ENCOUNTER — Ambulatory Visit: Admit: 2012-03-01 | Payer: Self-pay | Admitting: Neurosurgery

## 2012-03-01 SURGERY — LUMBAR SPINAL CORD STIMULATOR TRIAL
Anesthesia: General

## 2012-04-09 ENCOUNTER — Other Ambulatory Visit: Payer: Self-pay | Admitting: Internal Medicine

## 2012-04-11 ENCOUNTER — Other Ambulatory Visit: Payer: Self-pay | Admitting: Internal Medicine

## 2012-05-10 ENCOUNTER — Other Ambulatory Visit (INDEPENDENT_AMBULATORY_CARE_PROVIDER_SITE_OTHER): Payer: BC Managed Care – PPO

## 2012-05-10 ENCOUNTER — Ambulatory Visit (INDEPENDENT_AMBULATORY_CARE_PROVIDER_SITE_OTHER): Payer: BC Managed Care – PPO | Admitting: Internal Medicine

## 2012-05-10 ENCOUNTER — Encounter: Payer: Self-pay | Admitting: Internal Medicine

## 2012-05-10 VITALS — BP 110/82 | HR 87 | Temp 98.2°F | Ht 63.5 in | Wt 150.4 lb

## 2012-05-10 DIAGNOSIS — E785 Hyperlipidemia, unspecified: Secondary | ICD-10-CM

## 2012-05-10 DIAGNOSIS — G894 Chronic pain syndrome: Secondary | ICD-10-CM

## 2012-05-10 LAB — LIPID PANEL
Cholesterol: 128 mg/dL (ref 0–200)
HDL: 52.9 mg/dL (ref >39.00)
Total CHOL/HDL Ratio: 2
Triglycerides: 103 mg/dL (ref 0.0–149.0)

## 2012-05-10 MED ORDER — METOCLOPRAMIDE HCL 10 MG PO TABS: 10.0000 mg | ORAL_TABLET | Freq: Four times a day (QID) | ORAL | Status: DC

## 2012-05-10 NOTE — Assessment & Plan Note (Signed)
On crestor since 2008 - last dose increase 2009 Checks lipids q103mo - will order same now and consider q47mo if stable values and dose

## 2012-05-10 NOTE — Patient Instructions (Signed)
It was good to see you today. We have reviewed your prior records including labs and tests today Test(s) ordered today. Your results will be released to MyChart (or called to you) after review, usually within 72hours after test completion. If any changes need to be made, you will be notified at that same time. Medications reviewed and updated, no changes at this time pending lab results. Please schedule followup in 6 months, call sooner if problems.

## 2012-05-10 NOTE — Progress Notes (Signed)
  Subjective:    Patient ID: Lisa Arnold, female    DOB: 07/21/1960, 51 y.o.   MRN: [id number]  HPI  Here for follow up - reviewed chronic medical issues:  Dyslipidemia - on statin since 2008, last dose increase 2009. Takes only 1/2 tab (20mg  total daily) but insists her insurance "knows and is ok with me filling 40mg  tabs to save money"  Chronic pain syndrome - followed by pain mgmt V[facility] Clock) for same. symptoms  related to shoulder and neck pain - upcoming surgery this week for cervical cause of pain - also prior spinal cord stimulator placement  allergic rhinitis - hx septal surgery - on allergy shots x 4 years without significant improved symptoms so stopped same 1998 - the patient reports compliance with medication(s) as prescribed. Denies adverse side effects.    Past Medical History  Diagnosis Date  . Chronic pain syndrome     dr Vear Clock - pain mgmt  . OA (osteoarthritis)     multiple joints  . Allergic rhinitis, cause unspecified   . Hyperlipidemia   . GERD (gastroesophageal reflux disease)   . Migraines     Review of Systems  Respiratory: Negative for cough and shortness of breath.   Cardiovascular: Negative for chest pain or palpitations.      Objective:   Physical Exam  BP 110/82  Pulse 87  Temp 98.2 F (36.8 C) (Oral)  Ht 5' 3.5" (1.613 m)  Wt 150 lb 6.4 oz (68.221 kg)  BMI 26.22 kg/m2  SpO2 94% Wt Readings from Last 3 Encounters:  05/10/12 150 lb 6.4 oz (68.221 kg)  11/18/11 155 lb 6.4 oz (70.489 kg)  11/17/11 155 lb 6.4 oz (70.489 kg)   Constitutional: She appears well-developed and well-nourished. No distress.  Neck: Normal range of motion. Neck supple. No JVD present. No thyromegaly present.  Cardiovascular: Normal rate, regular rhythm and normal heart sounds.  No murmur heard. No BLE edema. Pulmonary/Chest: Effort normal and breath sounds normal. No respiratory distress. She has no wheezes.  Psychiatric: She has a normal mood and affect.  Her behavior is normal. Judgment and thought content normal.   Lab Results  Component Value Date   WBC 8.6 11/17/2011   HGB 13.6 11/17/2011   HCT 39.9 11/17/2011   PLT 351 11/17/2011   CHOL 156 11/18/2011   TRIG 115.0 11/18/2011   HDL 59.40 11/18/2011   LDLCALC 74 11/18/2011   ALT 27 11/18/2011   AST 23 11/18/2011   NA 140 03/03/2011   K 4.2 03/03/2011   CREATININE 0.9 03/03/2011   BUN 12 03/03/2011   HGBA1C 5.8 04/29/2009        Assessment & Plan:  See problem list. Medications and labs reviewed today.

## 2012-05-10 NOTE — Assessment & Plan Note (Signed)
Mgmt by Dr Vear Clock and various orthopedists Hx, meds and procedures reviewed The current medical regimen is effective;  continue present plan and medications.

## 2012-07-10 ENCOUNTER — Other Ambulatory Visit: Payer: Self-pay | Admitting: Internal Medicine

## 2012-07-31 IMAGING — CT CT SHOULDER*L* W/O CM
2 of 4 series · 4 of 14 positions shown, 5 images · non-contrast
Comparison: None.

CLINICAL DATA: Left shoulder pain.  Prior arthroscopy December 2010
with labral debridement, biceps tenotomy, abrasion chondroplasty,
and subacromial bursectomy.

CT OF THE LEFT SHOULDER WITHOUT CONTRAST
TECHNIQUE: Multidetector CT imaging was performed according to the
standard protocol. Multiplanar CT image reconstructions were also
generated.

[Series 3: shoulder bone · axial · 0.41mm/px · z∈[-121,-71]mm · 2 of 61 slices shown, 3 images]
[im 21/61  soft-tissue]
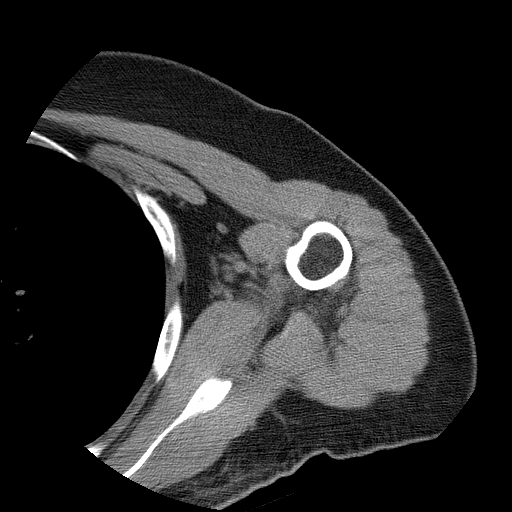
[im 21/61  bone]
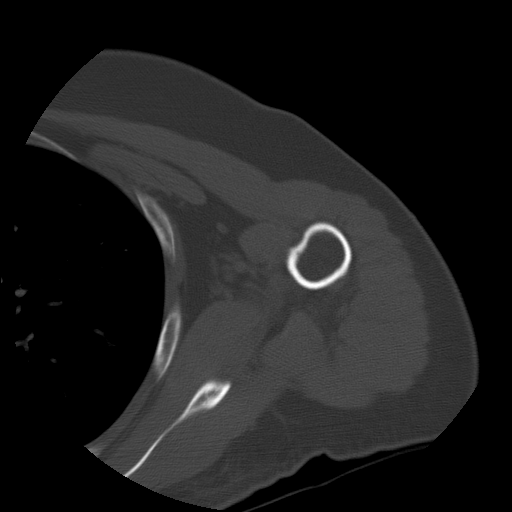
[im 41/61  bone]
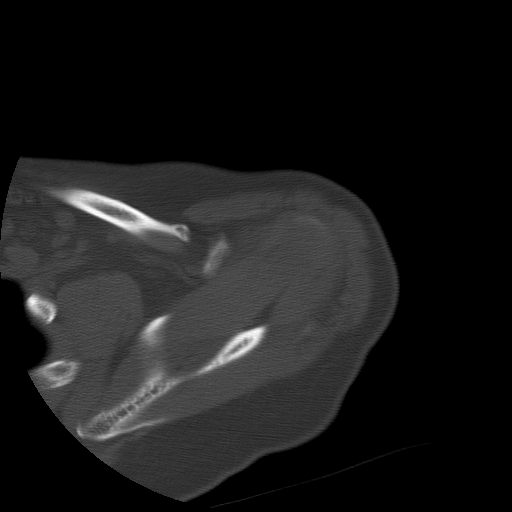

[Series 200: coronal · coronal · 0.41mm/px · 2 of 69 slices shown]
[im 23/69  bone]
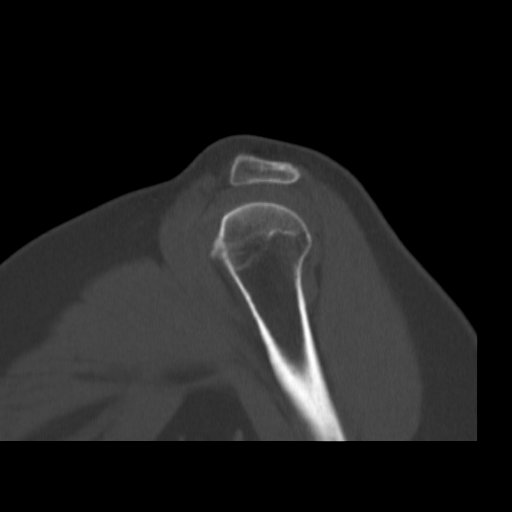
[im 46/69  bone]
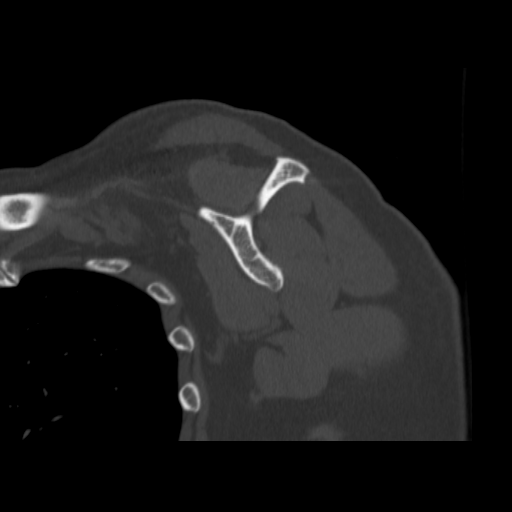

[4 of 14 positions shown; findings below may reference images not displayed]

FINDINGS: There is only mild degenerative acromioclavicular
arthropathy. The acromial undersurface is type 2 (curved).

No fatty atrophy of the rotator cuff musculature is identified.
Glenohumeral distance appears well maintained.  CT appearance of
the deltoid muscle is unremarkable.

Small degenerative subcortical cystic lesion noted centrally in the
glenoid on image 26 of series 201.

The long head of the biceps is not well seen in the proximal
bicipital groove, probably secondary to the tenotomy.  Trace fluid
noted in the subscapular recess, likely incidental.

The acromion is slightly laterally downsloping, as shown on image
27 of series 201, which may predispose to impingement.
IMPRESSION: 1.  Small degenerative subcortical cystic lesion centrally in the
glenoid.
2.  Laterally downsloping acromion may predispose to impingement.
3.  No significant CT abnormality of the rotator cuff musculature
is observed.
4.  The long head of the biceps is not well visualized, probably
secondary to the tenotomy.

## 2012-08-07 ENCOUNTER — Other Ambulatory Visit: Payer: Self-pay | Admitting: Internal Medicine

## 2012-08-29 ENCOUNTER — Other Ambulatory Visit: Payer: Self-pay | Admitting: *Deleted

## 2012-08-29 MED ORDER — FLUTICASONE PROPIONATE 50 MCG/ACT NA SUSP: NASAL | Status: DC

## 2012-08-29 MED ORDER — ROSUVASTATIN CALCIUM 40 MG PO TABS: ORAL_TABLET | ORAL | Status: DC

## 2012-08-29 MED ORDER — PANTOPRAZOLE SODIUM 40 MG PO TBEC: DELAYED_RELEASE_TABLET | ORAL | Status: DC

## 2012-08-29 MED ORDER — AZELASTINE HCL 0.1 % NA SOLN: NASAL | Status: DC

## 2012-08-29 MED ORDER — OMEGA-3-ACID ETHYL ESTERS 1 G PO CAPS: ORAL_CAPSULE | ORAL | Status: DC

## 2012-08-29 NOTE — Telephone Encounter (Signed)
R'cd fax from [facility] for refills of Azelastine, Fluticasone, Lovaza, Crestor and Pantoprazole.

## 2012-09-12 ENCOUNTER — Other Ambulatory Visit: Payer: Self-pay | Admitting: Internal Medicine

## 2012-09-12 DIAGNOSIS — Z1231 Encounter for screening mammogram for malignant neoplasm of breast: Secondary | ICD-10-CM

## 2012-09-30 ENCOUNTER — Ambulatory Visit (HOSPITAL_COMMUNITY)
Admission: RE | Admit: 2012-09-30 | Discharge: 2012-09-30 | Disposition: A | Discharge status: Home / Self Care | Payer: BC Managed Care – PPO | Source: Ambulatory Visit | Attending: Internal Medicine | Admitting: Internal Medicine

## 2012-09-30 DIAGNOSIS — Z1231 Encounter for screening mammogram for malignant neoplasm of breast: Secondary | ICD-10-CM

## 2012-10-23 ENCOUNTER — Other Ambulatory Visit: Payer: Self-pay | Admitting: Internal Medicine

## 2012-11-15 ENCOUNTER — Encounter: Payer: Self-pay | Admitting: Internal Medicine

## 2012-11-15 ENCOUNTER — Other Ambulatory Visit (INDEPENDENT_AMBULATORY_CARE_PROVIDER_SITE_OTHER): Payer: BC Managed Care – PPO

## 2012-11-15 ENCOUNTER — Ambulatory Visit (INDEPENDENT_AMBULATORY_CARE_PROVIDER_SITE_OTHER): Payer: BC Managed Care – PPO | Admitting: Internal Medicine

## 2012-11-15 VITALS — BP 120/84 | HR 81 | Temp 97.9°F | Wt 156.8 lb

## 2012-11-15 DIAGNOSIS — E785 Hyperlipidemia, unspecified: Secondary | ICD-10-CM

## 2012-11-15 DIAGNOSIS — G894 Chronic pain syndrome: Secondary | ICD-10-CM

## 2012-11-15 DIAGNOSIS — K3184 Gastroparesis: Secondary | ICD-10-CM | POA: Insufficient documentation

## 2012-11-15 LAB — LIPID PANEL
Cholesterol: 163 mg/dL (ref 0–200)
HDL: 62.2 mg/dL
LDL Cholesterol: 71 mg/dL (ref 0–99)
Total CHOL/HDL Ratio: 3
Triglycerides: 147 mg/dL (ref 0.0–149.0)
VLDL: 29.4 mg/dL (ref 0.0–40.0)

## 2012-11-15 NOTE — Assessment & Plan Note (Signed)
Mgmt by Dr Vear Clock and various orthopedists Hx, meds and procedures reviewed The current medical regimen is effective;  continue present plan and medications.

## 2012-11-15 NOTE — Assessment & Plan Note (Signed)
Chronic - uses Reglan for same exac by wean off Valium 09/2012 discussed other med options and/or GI eval - As pt improving with time since DC BZ, continue to wean Reglan use over time - will call if worse or unimproved

## 2012-11-15 NOTE — Assessment & Plan Note (Signed)
On crestor since 2008 - last dose increase 2009 Checks lipids q61mo - will order same now and consider q65mo if stable values and dose

## 2012-11-15 NOTE — Progress Notes (Signed)
  Subjective:    Patient ID: Lisa Arnold, female    DOB: Sep 19, 1960, 52 y.o.   MRN: [id number]  HPI  Here for follow up - reviewed chronic medical issues:  Dyslipidemia - on statin since 2008, last dose increase 2009. Takes only 1/2 tab (20mg  total daily) but insists her insurance "knows and is ok with me filling 40mg  tabs to save money"  Chronic pain syndrome - followed by pain mgmt [facility]) for same. Prior surgeries for same reviewed. symptoms related to shoulder and neck pain - also prior spinal cord stimulator placement  allergic rhinitis - hx septal surgery - on allergy shots x 4 years without significant improved symptoms so stopped same 1998 - the patient reports compliance with medication(s) as prescribed. Denies adverse side effects.   Complains of increased gastroparesis symptoms and abdominal pain since weaning off Valium. He has been off all benzodiazepines for 7 weeks. Denies nausea, vomiting or bowel change ( chronic constipation) - symptoms improved with Reglan but concerned about over usage and potential side effects related to Reglan-   Past Medical History  Diagnosis Date  . Chronic pain syndrome     dr Vear Clock - pain mgmt  . OA (osteoarthritis)     multiple joints  . Allergic rhinitis, cause unspecified   . Hyperlipidemia   . GERD (gastroesophageal reflux disease)   . Migraines     Review of Systems  Respiratory: Negative for cough and shortness of breath.   Cardiovascular: Negative for chest pain or palpitations.      Objective:   Physical Exam  BP 120/84  Pulse 81  Temp(Src) 97.9 F (36.6 C) (Oral)  Wt 156 lb 12.8 oz (71.124 kg)  BMI 27.34 kg/m2  SpO2 97% Wt Readings from Last 3 Encounters:  11/15/12 156 lb 12.8 oz (71.124 kg)  05/10/12 150 lb 6.4 oz (68.221 kg)  11/18/11 155 lb 6.4 oz (70.489 kg)   Constitutional: She appears well-developed and well-nourished. No distress.  Neck: Normal range of motion. Neck supple. No JVD present. No  thyromegaly present.  Cardiovascular: Normal rate, regular rhythm and normal heart sounds.  No murmur heard. No BLE edema. Pulmonary/Chest: Effort normal and breath sounds normal. No respiratory distress. She has no wheezes.  Psychiatric: She has a normal mood and affect. Her behavior is normal. Judgment and thought content normal.   Lab Results  Component Value Date   WBC 8.6 11/17/2011   HGB 13.6 11/17/2011   HCT 39.9 11/17/2011   PLT 351 11/17/2011   CHOL 128 05/10/2012   TRIG 103.0 05/10/2012   HDL 52.90 05/10/2012   LDLCALC 55 05/10/2012   ALT 27 11/18/2011   AST 23 11/18/2011   NA 140 03/03/2011   K 4.2 03/03/2011   CREATININE 0.9 03/03/2011   BUN 12 03/03/2011   HGBA1C 5.8 04/29/2009       Assessment & Plan:  See problem list. Medications and labs reviewed today.

## 2012-11-15 NOTE — Patient Instructions (Signed)
It was good to see you today. We have reviewed your prior records including labs and tests today Test(s) ordered today. Your results will be released to MyChart (or called to you) after review, usually within 72hours after test completion. If any changes need to be made, you will be notified at that same time. Medications reviewed and updated, no changes at this time pending lab results. Please schedule followup in 6 months, call sooner if problems.

## 2012-12-27 ENCOUNTER — Ambulatory Visit (INDEPENDENT_AMBULATORY_CARE_PROVIDER_SITE_OTHER): Payer: BC Managed Care – PPO | Admitting: Internal Medicine

## 2012-12-27 ENCOUNTER — Encounter: Payer: Self-pay | Admitting: Internal Medicine

## 2012-12-27 VITALS — BP 120/84 | HR 91 | Temp 99.0°F | Wt 158.1 lb

## 2012-12-27 DIAGNOSIS — N318 Other neuromuscular dysfunction of bladder: Secondary | ICD-10-CM

## 2012-12-27 DIAGNOSIS — N3281 Overactive bladder: Secondary | ICD-10-CM

## 2012-12-27 DIAGNOSIS — G894 Chronic pain syndrome: Secondary | ICD-10-CM

## 2012-12-27 MED ORDER — MIRABEGRON ER 25 MG PO TB24: 25.0000 mg | ORAL_TABLET | Freq: Every day | ORAL | Status: DC

## 2012-12-27 NOTE — Assessment & Plan Note (Signed)
Begin Myrbetiq trial - samples and new erx done we reviewed potential risk/benefit and possible side effects - pt understands and agrees to same

## 2012-12-27 NOTE — Patient Instructions (Signed)
It was good to see you today. We have reviewed your prior records including labs and tests today Medications reviewed and updated, will begin low-dose Myrbetriq for overactive bladder symptoms -  2 week samples given and co-pay card+ new prescription for same sent to your local pharmacy - no other medication changes recommended at this time. If symptoms improved but not resolved, we will consider increase dose to 50 mg daily If symptoms unchanged, call for consideration of other medical therapy as needed  Overactive Bladder, Adult The bladder has two functions that are totally opposite of the other. One is to relax and stretch out so it can store urine (fills like a balloon), and the other is to contract and squeeze down so that it can empty the urine that it has stored. Proper functioning of the bladder is a complex mixing of these two functions. The filling and emptying of the bladder can be influenced by:  The bladder.  The spinal cord.  The brain.  The nerves going to the bladder.  Other organs that are closely related to the bladder such as prostate in males and the vagina in females. As your bladder fills with urine, nerve signals are sent from the bladder to the brain to tell you that you may need to urinate. Normal urination requires that the bladder squeeze down with sufficient strength to empty the bladder, but this also requires that the bladder squeeze down sufficiently long to finish the job. In addition the sphincter muscles, which normally keep you from leaking urine, must also relax so that the urine can pass. Coordination between the bladder muscle squeezing down and the sphincter muscles relaxing is required to make everything happen normally. With an overactive bladder sometimes the muscles of the bladder contract unexpectedly and involuntarily and this causes an urgent need to urinate. The normal response is to try to hold urine in by contracting the sphincter muscles.  Sometimes the bladder contracts so strongly that the sphincter muscles cannot stop the urine from passing out and incontinence occurs. This kind of incontinence is called urge incontinence. Having an overactive bladder can be embarrassing and awkward. It can keep you from living life the way you want to. Many people think it is just something you have to put up with as you grow older or have certain health conditions. In fact, there are treatments that can help make your life easier and more pleasant. CAUSES  Many things can cause an overactive bladder. Possibilities include:  Urinary tract infection or infection of nearby tissues such as the prostate.  Prostate enlargement.  In women, multiple pregnancies or surgery on the uterus or urethra.  Bladder stones, inflammation or tumors.  Caffeine.  Alcohol.  Medications. For example, diuretics (drugs that help the body get rid of extra fluid) increase urine production. Some other medicines must be taken with lots of fluids.  Muscle or nerve weakness. This might be the result of a spinal cord injury, a stroke, multiple sclerosis or Parkinson's disease.  Diabetes can cause a high urine volume which fills the bladder so quickly that the normal urge to urinate is triggered very strongly. SYMPTOMS   Loss of bladder control. You feel the need to urinate and cannot make your body wait.  Sudden, strong urges to urinate.  Urinating 8 or more times a day.  Waking up to urinate two or more times a night. DIAGNOSIS  To decide if you have overactive bladder, your healthcare provider will probably:  Ask about  symptoms you have noticed.  Ask about your overall health. This will include questions about any medications you are taking.  Do a physical examination. This will help determine if there are obvious blockages or other problems.  Order some tests. These might include:  A blood test to check for diabetes or other health issues that could  be contributing to the problem.  Urine testing. This could measure the flow of urine and the pressure on the bladder.  A test of your neurological system (the brain, spinal cord and nerves). This is the system that senses the need to urinate. Some of these tests are called flow tests, bladder pressure tests and electrical measurements of the sphincter muscle.  A bladder test to check whether it is emptying completely when you urinate.  Cytoscopy. This test uses a thin tube with a tiny camera on it. It offers a look inside your urethra and bladder to see if there are problems.  Imaging tests. You might be given a contrast dye and then asked to urinate. X-rays are taken to see how your bladder is working. TREATMENT  An overactive bladder can be treated in many ways. The treatment will depend on the cause. Whether you have a mild or severe case also makes a difference. Often, treatment can be given in your healthcare provider's office or clinic. Be sure to discuss the different options with your caregiver. They include:  Behavioral treatments. These do not involve medication or surgery:  Bladder training. For this, you would follow a schedule to urinate at regular intervals. This helps you learn to control the urge to urinate. At first, you might be asked to wait a few minutes after feeling the urge. In time, you should be able to schedule bathroom visits an hour or more apart.  Kegel exercises. These exercises strengthen the pelvic floor muscles, which support the bladder. By toning these muscles, they can help control urination, even if the bladder muscles are overactive. A specialist will teach you how to do these exercises correctly. They will require daily practice.  Weight loss. If you are obese or overweight, losing weight might stop your bladder from being overactive. Talk to your healthcare provider about how many pounds you should lose. Also ask if there is a specific program or method  that would work best for you.  Diet change. This might be suggested if constipation is making your overactive bladder worse. Your healthcare provider or a nutritionist can explain ways to change what you eat to ease constipation. Other people might need to take in less caffeine or alcohol. Sometimes drinking fewer fluids is needed, too.  Protection. This is not an actual treatment. But, you could wear special pads to take care of any leakage while you wait for other treatments to take effect. This will help you avoid embarrassment.  Physical treatments.  Electrical stimulation. Electrodes will send gentle pulses to the nerves or muscles that help control the bladder. The goal is to strengthen them. Sometimes this is done with the electrodes outside of the body. Or, they might be placed inside the body (implanted). This treatment can take several months to have an effect.  Medications. These are usually used along with other treatments. Several medicines are available. Some are injected into the muscles involved in urination. Others come in pill form. Medications sometimes prescribed include:  Anticholinergics. These drugs block the signals that the nerves deliver to the bladder. This keeps it from releasing urine at the wrong time. Researchers  think the drugs might help in other ways, too.  Imipramine. This is an antidepressant. But, it relaxes bladder muscles.  Botox. This is still experimental. Some people believe that injecting it into the bladder muscles will relax them so they work more normally. It has also been injected into the sphincter muscle when the sphincter muscle does not open properly. This is a temporary fix, however. Also, it might make matters worse, especially in older people.  Surgery.  A device might be implanted to help manage your nerves. It works on the nerves that signal when you need to urinate.  Surgery is sometimes needed with electrical stimulation. If the  electrodes are implanted, this is done through surgery.  Sometimes repairs need to be made through surgery. For example, the size of the bladder can be changed. This is usually done in severe cases only. HOME CARE INSTRUCTIONS   Take any medications your healthcare provider prescribed or suggested. Follow the directions carefully.  Practice any lifestyle changes that are recommended. These might include:  Drinking less fluid or drinking at different times of the day. If you need to urinate often during the night, for example, you may need to stop drinking fluids early in the evening.  Cutting down on caffeine or alcohol. They can both make an overactive bladder worse. Caffeine is found in coffee, tea and sodas.  Doing Kegel exercises to strengthen muscles.  Losing weight, if that is recommended.  Eating a healthy and balanced diet. This will help you avoid constipation.  Keep a journal or a log. You might be asked to record how much you drink and when, and also when you feel the need to urinate.  Learn how to care for implants or other devices, such as pessaries. SEEK MEDICAL CARE IF:   Your overactive bladder gets worse.  You feel increased pain or irritation when you urinate.  You notice blood in your urine.  You have questions about any medications or devices that your healthcare provider recommended.  You notice blood, pus or swelling at the site of any test or treatment procedure.  You have an oral temperature above 102 F (38.9 C). SEEK IMMEDIATE MEDICAL CARE IF:  You have an oral temperature above 102 F (38.9 C), not controlled by medicine. Document Released: 03/07/2009 Document Revised: 08/03/2011 Document Reviewed: 03/07/2009 [facility] Patient Information 2014 [place], [place].

## 2012-12-27 NOTE — Assessment & Plan Note (Signed)
Mgmt by Dr Vear Clock and various orthopedists Hx, meds and procedures reviewed The current medical regimen is effective;  continue present plan and medications.

## 2012-12-27 NOTE — Progress Notes (Signed)
Subjective:    Patient ID: Lisa Arnold, female    DOB: 1960-10-22, 52 y.o.   MRN: [id number]  HPI Patient presents today with complaints of urinary frequency and urgency x 1-2 years, progressing significantly over the past 1-2 months. She complains that she wakes up every 2 hours a night to urinate, and it is preventing her from going out places for fear of not having a bathroom. She has mentioned it in previous visits with medical providers, but she is always worried about the drug treatment causing increased constipation and dry mucous membranes when combined with her chronic pain medications. However, she is finally at a point where fixing the overactive bladder is overshadowing the potential for side effects. She has not tried any medication in the past. She also admits to urinary leakage during coughing and laughing, but states that she has had this problem since giving birth, and it is not a concern to her.  She denies burning with urination, hematuria, changes in urine color, abdominal pain, and pelvic pain. A urinalysis was done at her job on Saturday, and it was completely clean.  She is currently taking percocet daily, and a few anticholinergic medications including elavil, benadryl, and astelin nasal spray. She is planning to stop the benadryl to help eliminate some of the anticholinergic ADRs.    Past Medical History  Diagnosis Date  . Chronic pain syndrome     dr Vear Clock - pain mgmt  . OA (osteoarthritis)     multiple joints  . Allergic rhinitis, cause unspecified   . Hyperlipidemia   . GERD (gastroesophageal reflux disease)   . Migraines    Family History  Problem Relation Age of Onset  . Arthritis Mother   . Arthritis Father   . Hyperlipidemia Father   . Heart disease Father   . Heart disease Sister   . Arthritis Other   . Ovarian cancer Other   . Prostate cancer Other   . Breast cancer Other    History  Substance Use Topics  . Smoking status: Former Games developer  .  Smokeless tobacco: Not on file  . Alcohol Use: No      Review of Systems  Constitutional: Negative for fever and chills.  Gastrointestinal: Negative for nausea, vomiting and diarrhea.  Genitourinary: Negative for flank pain.  Skin: Negative for rash.  Otherwise negative or as stated in HPI.      Objective:   Physical Exam  Constitutional: She is oriented to person, place, and time. She appears well-developed and well-nourished.  HENT:  Head: Normocephalic and atraumatic.  Eyes: EOM are normal. Pupils are equal, round, and reactive to light.  Cardiovascular: Normal rate and regular rhythm.   Pulmonary/Chest: Breath sounds normal.  Abdominal: Soft. Bowel sounds are normal. She exhibits no distension. There is no tenderness.  Neurological: She is alert and oriented to person, place, and time.  Skin: Skin is warm and dry.  Psychiatric: She has a normal mood and affect.    Lab Results  Component Value Date   WBC 8.6 11/17/2011   HGB 13.6 11/17/2011   HCT 39.9 11/17/2011   PLT 351 11/17/2011   CHOL 163 11/15/2012   TRIG 147.0 11/15/2012   HDL 62.20 11/15/2012   LDLCALC 71 11/15/2012   ALT 27 11/18/2011   AST 23 11/18/2011   NA 140 03/03/2011   K 4.2 03/03/2011   CREATININE 0.9 03/03/2011   BUN 12 03/03/2011   HGBA1C 5.8 04/29/2009  Assessment & Plan:  1. Overactive bladder - classic symptoms, hx same and progression of symptomatic disease despite conservative care- Start patient on Myrbetriq 25 mg q day. Two week sample was provided, and patient was instructed to assess improvement in symptoms as well as any increase in constipation or dry mouth over the next 2 weeks. She will then continue at current dose, increase, or try a new medication depending upon her success. She can modify her OTC constipation meds as needed after she assesses how the med is effecting her.   Concha Se, [place] [facility]ighbor   I have personally reviewed this case with PA student. I also personally  examined this patient. I agree with history and findings as documented above. I reviewed, discussed and approve of the assessment and plan as listed above. Rene Paci, MD

## 2012-12-31 ENCOUNTER — Encounter: Payer: Self-pay | Admitting: Internal Medicine

## 2013-01-02 MED ORDER — MIRABEGRON ER 50 MG PO TB24: 50.0000 mg | ORAL_TABLET | Freq: Every day | ORAL | Status: DC

## 2013-01-22 ENCOUNTER — Encounter: Payer: Self-pay | Admitting: Internal Medicine

## 2013-01-24 MED ORDER — SOLIFENACIN SUCCINATE 10 MG PO TABS: 10.0000 mg | ORAL_TABLET | Freq: Every day | ORAL | Status: DC

## 2013-01-31 ENCOUNTER — Other Ambulatory Visit: Payer: Self-pay | Admitting: *Deleted

## 2013-01-31 MED ORDER — SOLIFENACIN SUCCINATE 10 MG PO TABS: 10.0000 mg | ORAL_TABLET | Freq: Every day | ORAL | Status: DC

## 2013-03-20 ENCOUNTER — Other Ambulatory Visit: Payer: Self-pay | Admitting: Internal Medicine

## 2013-03-30 ENCOUNTER — Other Ambulatory Visit: Payer: Self-pay

## 2013-05-03 ENCOUNTER — Other Ambulatory Visit: Payer: Self-pay | Admitting: Internal Medicine

## 2013-05-08 ENCOUNTER — Other Ambulatory Visit (INDEPENDENT_AMBULATORY_CARE_PROVIDER_SITE_OTHER): Payer: BC Managed Care – PPO

## 2013-05-08 ENCOUNTER — Encounter: Payer: Self-pay | Admitting: Internal Medicine

## 2013-05-08 ENCOUNTER — Ambulatory Visit (INDEPENDENT_AMBULATORY_CARE_PROVIDER_SITE_OTHER): Payer: BC Managed Care – PPO | Admitting: Internal Medicine

## 2013-05-08 VITALS — BP 120/82 | HR 77 | Temp 98.1°F | Wt 163.0 lb

## 2013-05-08 DIAGNOSIS — Z0000 Encounter for general adult medical examination without abnormal findings: Secondary | ICD-10-CM

## 2013-05-08 DIAGNOSIS — N3281 Overactive bladder: Secondary | ICD-10-CM

## 2013-05-08 DIAGNOSIS — Z124 Encounter for screening for malignant neoplasm of cervix: Secondary | ICD-10-CM

## 2013-05-08 DIAGNOSIS — N318 Other neuromuscular dysfunction of bladder: Secondary | ICD-10-CM

## 2013-05-08 LAB — BASIC METABOLIC PANEL
BUN: 13 mg/dL (ref 6–23)
CO2: 31 mEq/L (ref 19–32)
Calcium: 9.6 mg/dL (ref 8.4–10.5)
Creatinine, Ser: 0.8 mg/dL (ref 0.4–1.2)
GFR: 79.2 mL/min (ref >60.00)

## 2013-05-08 LAB — URINALYSIS, ROUTINE W REFLEX MICROSCOPIC
Ketones, ur: NEGATIVE
Leukocytes, UA: NEGATIVE
RBC / HPF: NONE SEEN (ref 0–?)
Specific Gravity, Urine: 1.01 (ref 1.000–1.030)
Urine Glucose: NEGATIVE
Urobilinogen, UA: 0.2 (ref 0.0–1.0)
WBC, UA: NONE SEEN (ref 0–?)

## 2013-05-08 LAB — HEPATIC FUNCTION PANEL
ALT: 34 U/L (ref 0–35)
AST: 28 U/L (ref 0–37)
Albumin: 5 g/dL (ref 3.5–5.2)
Alkaline Phosphatase: 45 U/L (ref 39–117)
Bilirubin, Direct: 0.1 mg/dL (ref 0.0–0.3)
Total Bilirubin: 0.6 mg/dL (ref 0.3–1.2)
Total Protein: 7.2 g/dL (ref 6.0–8.3)

## 2013-05-08 LAB — CBC WITH DIFFERENTIAL/PLATELET
Basophils Absolute: 0 10*3/uL (ref 0.0–0.1)
Basophils Relative: 0.5 % (ref 0.0–3.0)
Eosinophils Absolute: 0.3 10*3/uL (ref 0.0–0.7)
Lymphocytes Relative: 39.4 % (ref 12.0–46.0)
MCHC: 34 g/dL (ref 30.0–36.0)
Monocytes Relative: 8.6 % (ref 3.0–12.0)
Neutrophils Relative %: 47.8 % (ref 43.0–77.0)
RBC: 4.36 Mil/uL (ref 3.87–5.11)
RDW: 12.5 % (ref 11.5–14.6)

## 2013-05-08 LAB — TSH: TSH: 2.29 u[IU]/mL (ref 0.35–5.50)

## 2013-05-08 NOTE — Progress Notes (Signed)
Pre-visit discussion using our clinic review tool. No additional management support is needed unless otherwise documented below in the visit note.  

## 2013-05-08 NOTE — Assessment & Plan Note (Signed)
No change with Myrbetiq 12/2012 despite titration Changed to Vesicare and improved The current medical regimen is effective;  continue present plan and medications.

## 2013-05-08 NOTE — Progress Notes (Signed)
Subjective:    Patient ID: Lisa Arnold, female    DOB: 01-Nov-1960, 52 y.o.   MRN: [id number]  HPI  patient is here today for annual physical. Patient feels well overall.  Also reviewed chronic medical issues and interval medical events  Past Medical History  Diagnosis Date  . Chronic pain syndrome     dr Vear Clock - pain mgmt  . OA (osteoarthritis)     multiple joints  . Allergic rhinitis, cause unspecified   . Hyperlipidemia   . GERD (gastroesophageal reflux disease)   . Migraines   . OAB (overactive bladder)    Family History  Problem Relation Age of Onset  . Arthritis Mother   . Arthritis Father   . Hyperlipidemia Father   . Heart disease Father   . Heart disease Sister   . Arthritis Other   . Ovarian cancer Other   . Prostate cancer Other   . Breast cancer Other    History  Substance Use Topics  . Smoking status: Former Games developer  . Smokeless tobacco: Not on file  . Alcohol Use: No    Review of Systems  Constitutional: Negative for fatigue and unexpected weight change.  Respiratory: Negative for cough, shortness of breath and wheezing.   Cardiovascular: Negative for chest pain, palpitations and leg swelling.  Gastrointestinal: Negative for nausea, abdominal pain and diarrhea.  Genitourinary: Negative for dysuria and urgency.  Musculoskeletal: Positive for neck pain (chronic w/o change).  Neurological: Negative for dizziness, weakness, light-headedness and headaches.  Psychiatric/Behavioral: Negative for dysphoric mood. The patient is not nervous/anxious.   All other systems reviewed and are negative.       Objective:   Physical Exam BP 120/82  Pulse 77  Temp(Src) 98.1 F (36.7 C) (Oral)  Wt 163 lb (73.936 kg)  SpO2 96% Wt Readings from Last 3 Encounters:  05/08/13 163 lb (73.936 kg)  12/27/12 158 lb 1.9 oz (71.723 kg)  11/15/12 156 lb 12.8 oz (71.124 kg)   Constitutional: She appears well-developed and well-nourished. No distress.  HENT:  Head: Normocephalic and atraumatic. Ears: B TMs ok, no erythema or effusion; Nose: Nose normal. Mouth/Throat: Oropharynx is clear and moist. No oropharyngeal exudate.  Eyes: Conjunctivae and EOM are normal. Pupils are equal, round, and reactive to light. No scleral icterus.  Neck: Normal range of motion. Neck supple. No JVD present. No thyromegaly present.  Cardiovascular: Normal rate, regular rhythm and normal heart sounds.  No murmur heard. No BLE edema. Pulmonary/Chest: Effort normal and breath sounds normal. No respiratory distress. She has no wheezes.  Abdominal: Soft. Bowel sounds are normal. She exhibits no distension. There is no tenderness. no masses Musculoskeletal: Normal range of motion, no joint effusions. No gross deformities Neurological: She is alert and oriented to person, place, and time. No cranial nerve deficit. Coordination, balance, strength, speech and gait are normal.  Skin: Skin is warm and dry. No rash noted. No erythema.  Psychiatric: She has a normal mood and affect. Her behavior is normal. Judgment and thought content normal.   Lab Results  Component Value Date   WBC 8.6 11/17/2011   HGB 13.6 11/17/2011   HCT 39.9 11/17/2011   PLT 351 11/17/2011   CHOL 163 11/15/2012   TRIG 147.0 11/15/2012   HDL 62.20 11/15/2012   LDLCALC 71 11/15/2012   ALT 27 11/18/2011   AST 23 11/18/2011   NA 140 03/03/2011   K 4.2 03/03/2011   CREATININE 0.9 03/03/2011   BUN 12 03/03/2011  HGBA1C 5.8 04/29/2009       Assessment & Plan:   CPX/v70.0 - Patient has been counseled on age-appropriate routine health concerns for screening and prevention. These are reviewed and up-to-date. Immunizations are up-to-date or declined. Labs ordered and reviewed.

## 2013-05-08 NOTE — Patient Instructions (Addendum)
It was good to see you today.  We have reviewed your prior records including labs and tests today  Health Maintenance reviewed - all recommended immunizations and age-appropriate screenings are up-to-date.  we'll make referral to Dr Penne Lash for PAP and pelvic exam. Our office will contact you regarding appointment(s) once made.  Medications reviewed and updated, no changes recommended at this time.  Please schedule followup in 6 months, call sooner if problems.  Work on lifestyle changes as discussed (low fat, low carb, increased protein diet; improved exercise efforts; weight loss) to control sugar, blood pressure and cholesterol levels and/or reduce risk of developing other medical problems. Look into [email or web address] or other type of food journal to assist you in this process.  Health Maintenance, Female A healthy lifestyle and preventative care can promote health and wellness.  Maintain regular health, dental, and eye exams.  Eat a healthy diet. Foods like vegetables, fruits, whole grains, low-fat dairy products, and lean protein foods contain the nutrients you need without too many calories. Decrease your intake of foods high in solid fats, added sugars, and salt. Get information about a proper diet from your caregiver, if necessary.  Regular physical exercise is one of the most important things you can do for your health. Most adults should get at least 150 minutes of moderate-intensity exercise (any activity that increases your heart rate and causes you to sweat) each week. In addition, most adults need muscle-strengthening exercises on 2 or more days a week.   Maintain a healthy weight. The body mass index (BMI) is a screening tool to identify possible weight problems. It provides an estimate of body fat based on height and weight. Your caregiver can help determine your BMI, and can help you achieve or maintain a healthy weight. For adults 20 years and older:  A BMI below 18.5 is  considered underweight.  A BMI of 18.5 to 24.9 is normal.  A BMI of 25 to 29.9 is considered overweight.  A BMI of 30 and above is considered obese.  Maintain normal blood lipids and cholesterol by exercising and minimizing your intake of saturated fat. Eat a balanced diet with plenty of fruits and vegetables. Blood tests for lipids and cholesterol should begin at age 76 and be repeated every 5 years. If your lipid or cholesterol levels are high, you are over 50, or you are a high risk for heart disease, you may need your cholesterol levels checked more frequently.Ongoing high lipid and cholesterol levels should be treated with medicines if diet and exercise are not effective.  If you smoke, find out from your caregiver how to quit. If you do not use tobacco, do not start.  Lung cancer screening is recommended for adults aged 89 80 years who are at high risk for developing lung cancer because of a history of smoking. Yearly low-dose computed tomography (CT) is recommended for people who have at least a 30-pack-year history of smoking and are a current smoker or have quit within the past 15 years. A pack year of smoking is smoking an average of 1 pack of cigarettes a day for 1 year (for example: 1 pack a day for 30 years or 2 packs a day for 15 years). Yearly screening should continue until the smoker has stopped smoking for at least 15 years. Yearly screening should also be stopped for people who develop a health problem that would prevent them from having lung cancer treatment.  If you are pregnant, do not drink alcohol. If  you are breastfeeding, be very cautious about drinking alcohol. If you are not pregnant and choose to drink alcohol, do not exceed 1 drink per day. One drink is considered to be 12 ounces (355 mL) of beer, 5 ounces (148 mL) of wine, or 1.5 ounces (44 mL) of liquor.  Avoid use of street drugs. Do not share needles with anyone. Ask for help if you need support or instructions  about stopping the use of drugs.  High blood pressure causes heart disease and increases the risk of stroke. Blood pressure should be checked at least every 1 to 2 years. Ongoing high blood pressure should be treated with medicines, if weight loss and exercise are not effective.  If you are 49 to 54 years old, ask your caregiver if you should take aspirin to prevent strokes.  Diabetes screening involves taking a blood sample to check your fasting blood sugar level. This should be done once every 3 years, after age 73, if you are within normal weight and without risk factors for diabetes. Testing should be considered at a younger age or be carried out more frequently if you are overweight and have at least 1 risk factor for diabetes.  Breast cancer screening is essential preventative care for women. You should practice "breast self-awareness." This means understanding the normal appearance and feel of your breasts and may include breast self-examination. Any changes detected, no matter how small, should be reported to a caregiver. Women in their 43s and 30s should have a clinical breast exam (CBE) by a caregiver as part of a regular health exam every 1 to 3 years. After age 80, women should have a CBE every year. Starting at age 64, women should consider having a mammogram (breast X-ray) every year. Women who have a family history of breast cancer should talk to their caregiver about genetic screening. Women at a high risk of breast cancer should talk to their caregiver about having an MRI and a mammogram every year.  Breast cancer gene (BRCA)-related cancer risk assessment is recommended for women who have family members with BRCA-related cancers. BRCA-related cancers include breast, ovarian, tubal, and peritoneal cancers. Having family members with these cancers may be associated with an increased risk for harmful changes (mutations) in the breast cancer genes BRCA1 and BRCA2. Results of the assessment  will determine the need for genetic counseling and BRCA1 and BRCA2 testing.  The Pap test is a screening test for cervical cancer. Women should have a Pap test starting at age 62. Between ages 40 and 21, Pap tests should be repeated every 2 years. Beginning at age 98, you should have a Pap test every 3 years as long as the past 3 Pap tests have been normal. If you had a hysterectomy for a problem that was not cancer or a condition that could lead to cancer, then you no longer need Pap tests. If you are between ages 8 and 44, and you have had normal Pap tests going back 10 years, you no longer need Pap tests. If you have had past treatment for cervical cancer or a condition that could lead to cancer, you need Pap tests and screening for cancer for at least 20 years after your treatment. If Pap tests have been discontinued, risk factors (such as a new sexual partner) need to be reassessed to determine if screening should be resumed. Some women have medical problems that increase the chance of getting cervical cancer. In these cases, your caregiver may recommend more  frequent screening and Pap tests.  The human papillomavirus (HPV) test is an additional test that may be used for cervical cancer screening. The HPV test looks for the virus that can cause the cell changes on the cervix. The cells collected during the Pap test can be tested for HPV. The HPV test could be used to screen women aged 53 years and older, and should be used in women of any age who have unclear Pap test results. After the age of 23, women should have HPV testing at the same frequency as a Pap test.  Colorectal cancer can be detected and often prevented. Most routine colorectal cancer screening begins at the age of 64 and continues through age 62. However, your caregiver may recommend screening at an earlier age if you have risk factors for colon cancer. On a yearly basis, your caregiver may provide home test kits to check for hidden blood  in the stool. Use of a small camera at the end of a tube, to directly examine the colon (sigmoidoscopy or colonoscopy), can detect the earliest forms of colorectal cancer. Talk to your caregiver about this at age 71, when routine screening begins. Direct examination of the colon should be repeated every 5 to 10 years through age 34, unless early forms of pre-cancerous polyps or small growths are found.  Hepatitis C blood testing is recommended for all people born from [birth year] through 1967 and any individual with known risks for hepatitis C.  Practice safe sex. Use condoms and avoid high-risk sexual practices to reduce the spread of sexually transmitted infections (STIs). Sexually active women aged 18 and younger should be checked for Chlamydia, which is a common sexually transmitted infection. Older women with new or multiple partners should also be tested for Chlamydia. Testing for other STIs is recommended if you are sexually active and at increased risk.  Osteoporosis is a disease in which the bones lose minerals and strength with aging. This can result in serious bone fractures. The risk of osteoporosis can be identified using a bone density scan. Women ages 1 and over and women at risk for fractures or osteoporosis should discuss screening with their caregivers. Ask your caregiver whether you should be taking a calcium supplement or vitamin D to reduce the rate of osteoporosis.  Menopause can be associated with physical symptoms and risks. Hormone replacement therapy is available to decrease symptoms and risks. You should talk to your caregiver about whether hormone replacement therapy is right for you.  Use sunscreen. Apply sunscreen liberally and repeatedly throughout the day. You should seek shade when your shadow is shorter than you. Protect yourself by wearing long sleeves, pants, a wide-brimmed hat, and sunglasses year round, whenever you are outdoors.  Notify your caregiver of new moles or  changes in moles, especially if there is a change in shape or color. Also notify your caregiver if a mole is larger than the size of a pencil eraser.  Stay current with your immunizations. Document Released: 11/24/2010 Document Revised: 09/05/2012 Document Reviewed: 11/24/2010 [facility] Patient Information 2014 [place], [place].

## 2013-07-07 ENCOUNTER — Other Ambulatory Visit: Payer: Self-pay | Admitting: Internal Medicine

## 2013-07-10 ENCOUNTER — Encounter: Payer: Self-pay | Admitting: Family Medicine

## 2013-07-10 ENCOUNTER — Ambulatory Visit (INDEPENDENT_AMBULATORY_CARE_PROVIDER_SITE_OTHER): Payer: BC Managed Care – PPO | Admitting: Family Medicine

## 2013-07-10 VITALS — BP 108/74 | HR 83 | Temp 97.9°F | Ht 63.0 in | Wt 160.1 lb

## 2013-07-10 DIAGNOSIS — Z124 Encounter for screening for malignant neoplasm of cervix: Secondary | ICD-10-CM

## 2013-07-10 NOTE — Progress Notes (Signed)
Subjective:     Patient ID: Lisa Arnold, female   DOB: [birth year], 53 y.o.   MRN: [id number]  HPI 53 y.o. F with fhx of breast and ovarian cancer here for screening exam. Is sexually active, no discharge, no complaints. Hx of reduction breast. Recent mammogram.  Review of Systems No weight loss, abnormal bleedin. +hot flashes but improving. No other complaints regarding female function  LMP @ age 89.    Objective:   Physical Exam Filed Vitals:   07/10/13 1358  BP: 108/74  Pulse: 83  Temp: 97.9 F (36.6 C)   Breast: No lumps or masses appreciated, no discharge Abd: palpated battery for spnial stimulator, NTTP, no masses appreciated SVE/SSE: no discharge, mild hypoestrogen changes. Minimal bleeding following pap. No CMT, no adenexal masses or tenderness     Assessment:     53 y.o. F here for pap smear and screening     Plan:     Pap smear today with HPV, if neg repeat in 5 years Continue annual ovarian exams and mammograms.  Tawana ScaleMichael Ryan Arles Rumbold, MD OB Fellow

## 2013-07-13 ENCOUNTER — Encounter: Payer: Self-pay | Admitting: Family Medicine

## 2013-07-13 ENCOUNTER — Telehealth: Payer: Self-pay | Admitting: *Deleted

## 2013-07-13 DIAGNOSIS — Z124 Encounter for screening for malignant neoplasm of cervix: Secondary | ICD-10-CM | POA: Insufficient documentation

## 2013-07-13 NOTE — Telephone Encounter (Signed)
Message copied by Dorothyann PengHAIZLIP, Hektor Huston E on Thu Jul 13, 2013  3:55 PM ------      Message from: Jolyn LentDOM, MICHAEL R      Created: Thu Jul 13, 2013  9:25 AM       Please call pt and inform:Neg Pap smear with neg HPV - repeat 5 year            Tawana ScaleMichael Ryan Odom, MD      OB Fellow       ------

## 2013-07-13 NOTE — Telephone Encounter (Signed)
Spoke with patient concerning results and recommendation, pt verbalizes understanding.

## 2013-09-18 ENCOUNTER — Encounter: Payer: Self-pay | Admitting: Internal Medicine

## 2013-09-18 ENCOUNTER — Other Ambulatory Visit (INDEPENDENT_AMBULATORY_CARE_PROVIDER_SITE_OTHER): Payer: BC Managed Care – PPO

## 2013-09-18 ENCOUNTER — Ambulatory Visit (INDEPENDENT_AMBULATORY_CARE_PROVIDER_SITE_OTHER): Payer: BC Managed Care – PPO | Admitting: Internal Medicine

## 2013-09-18 VITALS — BP 120/70 | HR 101 | Temp 97.6°F | Wt 162.4 lb

## 2013-09-18 DIAGNOSIS — H04123 Dry eye syndrome of bilateral lacrimal glands: Secondary | ICD-10-CM

## 2013-09-18 DIAGNOSIS — H04129 Dry eye syndrome of unspecified lacrimal gland: Secondary | ICD-10-CM

## 2013-09-18 DIAGNOSIS — K117 Disturbances of salivary secretion: Secondary | ICD-10-CM

## 2013-09-18 DIAGNOSIS — M1990 Unspecified osteoarthritis, unspecified site: Secondary | ICD-10-CM

## 2013-09-18 DIAGNOSIS — R682 Dry mouth, unspecified: Principal | ICD-10-CM

## 2013-09-18 LAB — CBC WITH DIFFERENTIAL/PLATELET
Basophils Absolute: 0 10*3/uL (ref 0.0–0.1)
Basophils Relative: 0.6 % (ref 0.0–3.0)
EOS PCT: 3.8 % (ref 0.0–5.0)
Eosinophils Absolute: 0.3 10*3/uL (ref 0.0–0.7)
HEMATOCRIT: 38.4 % (ref 36.0–46.0)
HEMOGLOBIN: 13 g/dL (ref 12.0–15.0)
LYMPHS ABS: 2.1 10*3/uL (ref 0.7–4.0)
Lymphocytes Relative: 27.2 % (ref 12.0–46.0)
MCHC: 33.8 g/dL (ref 30.0–36.0)
MCV: 89.9 fl (ref 78.0–100.0)
MONO ABS: 0.6 10*3/uL (ref 0.1–1.0)
MONOS PCT: 7.7 % (ref 3.0–12.0)
NEUTROS ABS: 4.6 10*3/uL (ref 1.4–7.7)
Neutrophils Relative %: 60.7 % (ref 43.0–77.0)
Platelets: 293 10*3/uL (ref 150.0–400.0)
RBC: 4.27 Mil/uL (ref 3.87–5.11)
RDW: 12.5 % (ref 11.5–14.6)
WBC: 7.6 10*3/uL (ref 4.5–10.5)

## 2013-09-18 LAB — RHEUMATOID FACTOR: Rhuematoid fact SerPl-aCnc: <10 IU/mL (ref <=14)

## 2013-09-18 LAB — BASIC METABOLIC PANEL
BUN: 17 mg/dL (ref 6–23)
CO2: 28 mEq/L (ref 19–32)
Calcium: 9.4 mg/dL (ref 8.4–10.5)
Chloride: 103 mEq/L (ref 96–112)
Creatinine, Ser: 0.7 mg/dL (ref 0.4–1.2)
GFR: 93.81 mL/min (ref >60.00)
GLUCOSE: 84 mg/dL (ref 70–99)
POTASSIUM: 4 meq/L (ref 3.5–5.1)
Sodium: 139 mEq/L (ref 135–145)

## 2013-09-18 LAB — CK: Total CK: 240 U/L — ABNORMAL HIGH (ref 7–177)

## 2013-09-18 LAB — SEDIMENTATION RATE: Sed Rate: 9 mm/hr (ref 0–22)

## 2013-09-18 NOTE — Patient Instructions (Signed)
Your next office appointment will be determined based upon review of your pending labs . Those instructions will be transmitted to you through My Chart  OR  by mail;whichever process is your choice to receive results & recommendations .  Share results with all non [facility] medical staff seen  

## 2013-09-18 NOTE — Progress Notes (Signed)
Subjective:    Patient ID: Lisa Arnold, female    DOB: [birth year], 53 y.o.   MRN: [id number]  HPI   She was referred by her Eye Specialist, Dr. Clearance CootsHarper who documented absence of tear production. A plug was placed in the left tear duct but it was not felt that this would be of benefit on the right.  Her dry eyes and dry mouth have  been present 2- 3 years but are progressive. She's not been able to wear contacts for 2 years and uses eyedrops for moisture.  She also uses Biotene mouthwash for dry mouth.  The dryness results in strain with diplopia after she reads for 30 minutes or more.  Her past history is positive for osteoarthritis. 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 She's also had arthroscopy of the left shoulder  2 maternal aunts have lupus.      Review of Systems  She describes diffuse arthralgias of her entire spine as well as her shoulders  She's had some dysphagia but she relates that to the neck fusion rather than to here dry mouth     Objective:   Physical Exam Gen.: Healthy and well-nourished in appearance. Alert, appropriate and cooperative throughout exam. Appears younger than stated age  Head: Normocephalic without obvious abnormalities Eyes: No corneal or conjunctival inflammation noted. Pupils equal round reactive to light and accommodation. Ears: External  ear exam reveals no significant lesions or deformities. Canals clear .TMs normal. Hearing is grossly normal bilaterally. Nose: External nasal exam reveals no deformity or inflammation. Nasal mucosa are pink and moist. No lesions or exudates noted.   Mouth: Oral mucosa and oropharynx reveal no lesions or exudates. Teeth in good repair. Neck: No deformities, masses, or tenderness noted. Range of motion surprisingly good. Thyroid normal. Op scar anteriorly Lungs: Normal respiratory effort; chest expands symmetrically. Lungs are clear to auscultation without rales,  wheezes, or increased work of breathing. Heart: Normal rate and rhythm. Normal S1 and S2. No gallop, click, or rub. No murmur. Abdomen: Bowel sounds normal; abdomen soft and nontender. No masses, organomegaly or hernias noted.                                Musculoskeletal/extremities: Slightly accentuated curvature of upper thoracic spine. Op scar LS spine No clubbing, cyanosis, edema, or significant extremity  deformity noted. Range of motion normal .Tone & strength normal. Hand joints normal  Fingernail  health good. Able to lie down & sit up w/o help. Negative SLR bilaterally Vascular: Carotid, radial artery, dorsalis pedis and  posterior tibial pulses are full and equal. No bruits present. Neurologic: Alert and oriented x3. Deep tendon reflexes symmetrical and normal.  Gait normal . Skin: Intact without suspicious lesions or rashes. Lymph: No cervical, axillary lymphadenopathy present. Psych: Mood and affect are normal. 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

## 2013-09-18 NOTE — Progress Notes (Signed)
Pre visit review using our clinic review tool, if applicable. No additional management support is needed unless otherwise documented below in the visit note. 

## 2013-09-19 ENCOUNTER — Other Ambulatory Visit: Payer: Self-pay | Admitting: Internal Medicine

## 2013-09-19 DIAGNOSIS — K117 Disturbances of salivary secretion: Secondary | ICD-10-CM | POA: Insufficient documentation

## 2013-09-19 DIAGNOSIS — H04123 Dry eye syndrome of bilateral lacrimal glands: Secondary | ICD-10-CM | POA: Insufficient documentation

## 2013-09-19 DIAGNOSIS — R682 Dry mouth, unspecified: Principal | ICD-10-CM

## 2013-09-19 LAB — ANA: Anti Nuclear Antibody(ANA): NEGATIVE

## 2013-10-07 ENCOUNTER — Other Ambulatory Visit: Payer: Self-pay | Admitting: Internal Medicine

## 2013-10-24 ENCOUNTER — Other Ambulatory Visit: Payer: Self-pay | Admitting: Internal Medicine

## 2013-10-24 DIAGNOSIS — Z1231 Encounter for screening mammogram for malignant neoplasm of breast: Secondary | ICD-10-CM

## 2013-11-01 ENCOUNTER — Other Ambulatory Visit: Payer: Self-pay | Admitting: Internal Medicine

## 2013-11-01 ENCOUNTER — Ambulatory Visit (HOSPITAL_COMMUNITY)
Admission: RE | Admit: 2013-11-01 | Discharge: 2013-11-01 | Disposition: A | Discharge status: Home / Self Care | Payer: BC Managed Care – PPO | Source: Ambulatory Visit | Attending: Internal Medicine | Admitting: Internal Medicine

## 2013-11-01 DIAGNOSIS — Z1231 Encounter for screening mammogram for malignant neoplasm of breast: Secondary | ICD-10-CM

## 2013-11-01 DIAGNOSIS — Z803 Family history of malignant neoplasm of breast: Secondary | ICD-10-CM | POA: Insufficient documentation

## 2013-11-08 ENCOUNTER — Other Ambulatory Visit (INDEPENDENT_AMBULATORY_CARE_PROVIDER_SITE_OTHER): Payer: BC Managed Care – PPO

## 2013-11-08 ENCOUNTER — Encounter: Payer: Self-pay | Admitting: Internal Medicine

## 2013-11-08 ENCOUNTER — Ambulatory Visit (INDEPENDENT_AMBULATORY_CARE_PROVIDER_SITE_OTHER): Payer: BC Managed Care – PPO | Admitting: Internal Medicine

## 2013-11-08 VITALS — BP 120/90 | HR 78 | Temp 98.6°F | Wt 153.0 lb

## 2013-11-08 DIAGNOSIS — N952 Postmenopausal atrophic vaginitis: Secondary | ICD-10-CM

## 2013-11-08 DIAGNOSIS — E785 Hyperlipidemia, unspecified: Secondary | ICD-10-CM

## 2013-11-08 LAB — LIPID PANEL
CHOLESTEROL: 111 mg/dL (ref 0–200)
HDL: 48.9 mg/dL (ref >39.00)
LDL Cholesterol: 46 mg/dL (ref 0–99)
NonHDL: 62.1
TRIGLYCERIDES: 80 mg/dL (ref 0.0–149.0)
Total CHOL/HDL Ratio: 2
VLDL: 16 mg/dL (ref 0.0–40.0)

## 2013-11-08 NOTE — Progress Notes (Signed)
Pre visit review using our clinic review tool, if applicable. No additional management support is needed unless otherwise documented below in the visit note. 

## 2013-11-08 NOTE — Assessment & Plan Note (Signed)
On crestor since 2008 - last dose increase 2009 Checks lipids q9366mo -  will order same now, then annually stating 04/2014 if stable values and dose

## 2013-11-08 NOTE — Assessment & Plan Note (Signed)
Follows with gyn for same Discussed risk and benefit of topical premarin vs other lubricant Given FH cancer, favor non hormonal approach at this time

## 2013-11-08 NOTE — Patient Instructions (Signed)
It was good to see you today.  We have reviewed your prior records including labs and tests today  Test(s) ordered today. Your results will be released to MyChart (or called to you) after review, usually within 72hours after test completion. If any changes need to be made, you will be notified at that same time.  Medications reviewed and updated, no changes recommended at this time.  Please schedule followup in 6 months for annual and labs (come fasting); call sooner if problems.

## 2013-11-08 NOTE — Progress Notes (Signed)
Subjective:    Patient ID: Lisa SermonsBernadette L Haverstick, female    DOB: 09-Sep-1960, 53 y.o.   MRN: [id number]  HPI  Patient is here for follow up  Reviewed chronic medical issues and interval medical events  Past Medical History  Diagnosis Date  . Chronic pain syndrome     dr Vear Clockphillips - pain mgmt  . OA (osteoarthritis)     multiple joints  . Allergic rhinitis, cause unspecified   . Hyperlipidemia   . GERD (gastroesophageal reflux disease)   . Migraines   . OAB (overactive bladder)     Review of Systems  Constitutional: Negative for unexpected weight change (down 10# in 8 weeks with Nutrisystem).  Respiratory: Negative for cough and shortness of breath.   Cardiovascular: Negative for chest pain and leg swelling.  Genitourinary: Positive for dyspareunia. Negative for dysuria.       Objective:   Physical Exam  BP 120/90  Pulse 78  Temp(Src) 98.6 F (37 C) (Oral)  Wt 153 lb (69.4 kg)  SpO2 98% Wt Readings from Last 3 Encounters:  11/08/13 153 lb (69.4 kg)  09/18/13 162 lb 6.4 oz (73.664 kg)  07/10/13 160 lb 1.6 oz (72.621 kg)   Constitutional: She appears well-developed and well-nourished. No distress.  Neck: Normal range of motion. Neck supple. No JVD present. No thyromegaly present.  Cardiovascular: Normal rate, regular rhythm and normal heart sounds.  No murmur heard. No BLE edema. Pulmonary/Chest: Effort normal and breath sounds normal. No respiratory distress. She has no wheezes.  Psychiatric: She has a normal mood and affect. Her behavior is normal. Judgment and thought content normal.   Lab Results  Component Value Date   WBC 7.6 09/18/2013   HGB 13.0 09/18/2013   HCT 38.4 09/18/2013   PLT 293.0 09/18/2013   GLUCOSE 84 09/18/2013   CHOL 163 11/15/2012   TRIG 147.0 11/15/2012   HDL 62.20 11/15/2012   LDLCALC 71 11/15/2012   ALT 34 05/08/2013   AST 28 05/08/2013   NA 139 09/18/2013   K 4.0 09/18/2013   CL 103 09/18/2013   CREATININE 0.7 09/18/2013   BUN 17 09/18/2013   CO2 28 09/18/2013   TSH 2.29 05/08/2013   HGBA1C 5.8 04/29/2009    Mm Digital Screening Bilateral  11/02/2013   CLINICAL DATA:  Screening. Family history of breast cancer in an aunt at age 55 an aunt at age 55. Bilateral breast reduction in 2008  EXAM: DIGITAL SCREENING BILATERAL MAMMOGRAM WITH CAD  COMPARISON:  Previous exam(s).  ACR Breast Density Category d: The breast tissue is extremely dense, which lowers the sensitivity of mammography.  FINDINGS: There are no findings suspicious for malignancy. Images were processed with CAD.  IMPRESSION: No mammographic evidence of malignancy. A result letter of this screening mammogram will be mailed directly to the patient.  RECOMMENDATION: Screening mammogram in one year. (Code:SM-B-01Y)  BI-RADS CATEGORY  1: Negative.   Electronically Signed   By: Leda GauzeBecky  Kennedy M.D.   On: 11/02/2013 16:38       Assessment & Plan:   Problem List Items Addressed This Visit   Atrophic vaginitis     Follows with gyn for same Discussed risk and benefit of topical premarin vs other lubricant Given FH cancer, favor non hormonal approach at this time    Hyperlipidemia - Primary     On crestor since 2008 - last dose increase 2009 Checks lipids q4150mo -  will order same now, then annually stating 04/2014 if stable values  and dose    Relevant Orders      Lipid panel

## 2014-01-02 ENCOUNTER — Other Ambulatory Visit: Payer: Self-pay

## 2014-01-02 MED ORDER — FLUTICASONE PROPIONATE 50 MCG/ACT NA SUSP: 2.0000 | Freq: Every day | NASAL | Status: AC

## 2014-01-02 MED ORDER — METHOCARBAMOL 500 MG PO TABS: 500.0000 mg | ORAL_TABLET | Freq: Four times a day (QID) | ORAL | Status: AC | PRN

## 2014-01-02 MED ORDER — AMITRIPTYLINE HCL 75 MG PO TABS: 75.0000 mg | ORAL_TABLET | Freq: Every day | ORAL | Status: AC

## 2014-01-02 MED ORDER — AZELASTINE HCL 0.1 % NA SOLN: 1.0000 | Freq: Two times a day (BID) | NASAL | Status: AC

## 2014-01-02 MED ORDER — SOLIFENACIN SUCCINATE 10 MG PO TABS: 10.0000 mg | ORAL_TABLET | Freq: Every day | ORAL | Status: AC

## 2014-01-02 MED ORDER — ROSUVASTATIN CALCIUM 40 MG PO TABS: 40.0000 mg | ORAL_TABLET | Freq: Every day | ORAL | Status: DC

## 2014-01-02 MED ORDER — OMEGA-3-ACID ETHYL ESTERS 1 G PO CAPS: 1.0000 g | ORAL_CAPSULE | Freq: Two times a day (BID) | ORAL | Status: AC

## 2014-01-02 MED ORDER — PANTOPRAZOLE SODIUM 40 MG PO TBEC: 40.0000 mg | DELAYED_RELEASE_TABLET | Freq: Every day | ORAL | Status: AC

## 2014-01-02 MED ORDER — METOCLOPRAMIDE HCL 10 MG PO TABS: 10.0000 mg | ORAL_TABLET | Freq: Four times a day (QID) | ORAL | Status: AC | PRN

## 2014-01-02 NOTE — Telephone Encounter (Signed)
[facility] mail service is the pt requested pharmacy to refill rx.

## 2014-01-02 NOTE — Telephone Encounter (Signed)
i have done ALL med refills to new phrm - thanks

## 2014-01-02 NOTE — Telephone Encounter (Signed)
LVM that request was completed electronically.

## 2014-01-31 ENCOUNTER — Telehealth: Payer: Self-pay

## 2014-01-31 NOTE — Telephone Encounter (Signed)
Resubmitted PA for Crestor.

## 2014-02-01 ENCOUNTER — Telehealth: Payer: Self-pay

## 2014-02-01 NOTE — Telephone Encounter (Signed)
Faxed recvd requiring additional information for the PA for Crestor.  Information has been gathered and sent back to plan.

## 2014-02-05 ENCOUNTER — Telehealth: Payer: Self-pay

## 2014-02-05 NOTE — Telephone Encounter (Signed)
Let pt know same I recommended substituting atorvastatin 80 mg for Crestor  - If pt agrees, please send same No other med alternates to recommended at this time - thanks

## 2014-02-05 NOTE — Telephone Encounter (Signed)
PA for Crestor Denied

## 2014-02-06 ENCOUNTER — Other Ambulatory Visit: Payer: Self-pay

## 2014-02-06 MED ORDER — ATORVASTATIN CALCIUM 80 MG PO TABS: 80.0000 mg | ORAL_TABLET | Freq: Every day | ORAL | Status: AC

## 2014-02-06 MED ORDER — ATORVASTATIN CALCIUM 80 MG PO TABS: 80.0000 mg | ORAL_TABLET | Freq: Every day | ORAL | Status: DC

## 2014-02-20 ENCOUNTER — Telehealth: Payer: Self-pay

## 2014-02-20 NOTE — Telephone Encounter (Signed)
Ok to refill 

## 2014-02-20 NOTE — Telephone Encounter (Signed)
Patient called [facility] stating that protonix requires PA. Patient also requesting refill for naprosyn. Thanks

## 2014-02-21 ENCOUNTER — Ambulatory Visit (INDEPENDENT_AMBULATORY_CARE_PROVIDER_SITE_OTHER): Payer: BC Managed Care – PPO

## 2014-02-21 DIAGNOSIS — Z23 Encounter for immunization: Secondary | ICD-10-CM

## 2014-02-21 NOTE — Telephone Encounter (Signed)
Pt came by office stating her pharmacy is different and Rx med coverage has changed due to her new coverage. PROTONIX is no longer covered so this Rx needs to be changed in order for pt to have coverage. Her options are: NEXIUM, GRANULAR, PREVACID-SOLUTAB. Pt states the pharmacy doesn't cover LOVAZA at all and she will need an alternative to that med. Pt states the pharmacy will need prior authorization for new med alternative for PROTONIX that is selected. Please contact pharmacy [phone] for any further questions or concerns. Please contact pt when request is reviewed.

## 2014-02-22 ENCOUNTER — Other Ambulatory Visit: Payer: Self-pay

## 2014-02-22 MED ORDER — ESOMEPRAZOLE MAGNESIUM 40 MG PO CPDR: 40.0000 mg | DELAYED_RELEASE_CAPSULE | Freq: Every day | ORAL | Status: DC

## 2014-02-22 MED ORDER — NAPROXEN 500 MG PO TABS: 500.0000 mg | ORAL_TABLET | Freq: Two times a day (BID) | ORAL | Status: AC

## 2014-03-08 ENCOUNTER — Telehealth: Payer: Self-pay | Admitting: *Deleted

## 2014-03-08 MED ORDER — ESOMEPRAZOLE MAGNESIUM 40 MG PO CPDR: 40.0000 mg | DELAYED_RELEASE_CAPSULE | Freq: Every day | ORAL | Status: DC

## 2014-03-08 NOTE — Telephone Encounter (Signed)
rx resent to correct pharmacy 

## 2014-03-12 ENCOUNTER — Other Ambulatory Visit: Payer: Self-pay

## 2014-03-12 MED ORDER — ESOMEPRAZOLE MAGNESIUM 40 MG PO CPDR: 40.0000 mg | DELAYED_RELEASE_CAPSULE | Freq: Every day | ORAL | Status: AC

## 2016-03-15 ENCOUNTER — Other Ambulatory Visit: Payer: Self-pay | Admitting: Internal Medicine
